# Patient Record
Sex: Male | Born: 1965 | Race: Black or African American | Hispanic: No | Marital: Married | Smoking: Never smoker
Health system: Southern US, Community
[De-identification: ages and names within clinical notes are randomized; demographics above are authoritative.]

## PROBLEM LIST (undated history)

## (undated) DIAGNOSIS — N529 Male erectile dysfunction, unspecified: Secondary | ICD-10-CM

## (undated) DIAGNOSIS — K648 Other hemorrhoids: Secondary | ICD-10-CM

## (undated) DIAGNOSIS — I10 Essential (primary) hypertension: Secondary | ICD-10-CM

## (undated) DIAGNOSIS — E785 Hyperlipidemia, unspecified: Secondary | ICD-10-CM

## (undated) DIAGNOSIS — M722 Plantar fascial fibromatosis: Secondary | ICD-10-CM

## (undated) DIAGNOSIS — E7800 Pure hypercholesterolemia, unspecified: Secondary | ICD-10-CM

## (undated) DIAGNOSIS — J309 Allergic rhinitis, unspecified: Secondary | ICD-10-CM

## (undated) HISTORY — DX: Essential (primary) hypertension: I10

## (undated) HISTORY — DX: Other hemorrhoids: K64.8

## (undated) HISTORY — DX: Male erectile dysfunction, unspecified: N52.9

## (undated) HISTORY — DX: Allergic rhinitis, unspecified: J30.9

## (undated) HISTORY — DX: Pure hypercholesterolemia, unspecified: E78.00

## (undated) HISTORY — DX: Plantar fascial fibromatosis: M72.2

## (undated) HISTORY — PX: FACIAL FRACTURE SURGERY: SHX1570

## (undated) HISTORY — PX: OTHER SURGICAL HISTORY: SHX169

## (undated) HISTORY — DX: Hyperlipidemia, unspecified: E78.5

---

## 1999-05-24 ENCOUNTER — Encounter (INDEPENDENT_AMBULATORY_CARE_PROVIDER_SITE_OTHER): Payer: Self-pay | Admitting: Specialist

## 1999-05-24 ENCOUNTER — Other Ambulatory Visit: Admission: RE | Admit: 1999-05-24 | Discharge: 1999-05-24 | Payer: Self-pay | Admitting: Surgery

## 2005-10-24 ENCOUNTER — Ambulatory Visit: Payer: Self-pay | Admitting: Internal Medicine

## 2006-04-15 ENCOUNTER — Ambulatory Visit: Payer: Self-pay | Admitting: Internal Medicine

## 2006-04-22 ENCOUNTER — Ambulatory Visit: Payer: Self-pay | Admitting: Internal Medicine

## 2007-05-07 ENCOUNTER — Ambulatory Visit: Payer: Self-pay | Admitting: Internal Medicine

## 2007-05-07 LAB — CONVERTED CEMR LAB
ALT: 19 units/L (ref 0–53)
AST: 18 units/L (ref 0–37)
Albumin: 3.8 g/dL (ref 3.5–5.2)
Alkaline Phosphatase: 57 units/L (ref 39–117)
BUN: 11 mg/dL (ref 6–23)
Basophils Absolute: 0.2 10*3/uL — ABNORMAL HIGH (ref 0.0–0.1)
Basophils Relative: 2.2 % — ABNORMAL HIGH (ref 0.0–1.0)
Bilirubin Urine: NEGATIVE
Bilirubin, Direct: 0.1 mg/dL (ref 0.0–0.3)
CO2: 32 meq/L (ref 19–32)
Calcium: 10 mg/dL (ref 8.4–10.5)
Chloride: 115 meq/L — ABNORMAL HIGH (ref 96–112)
Cholesterol: 227 mg/dL (ref 0–200)
Creatinine, Ser: 1.1 mg/dL (ref 0.4–1.5)
Direct LDL: 152.9 mg/dL
Eosinophils Absolute: 0.1 10*3/uL (ref 0.0–0.6)
Eosinophils Relative: 1.2 % (ref 0.0–5.0)
GFR calc Af Amer: 94 mL/min
GFR calc non Af Amer: 78 mL/min
Glucose, Bld: 96 mg/dL (ref 70–99)
HCT: 42.7 % (ref 39.0–52.0)
HDL: 38.1 mg/dL — ABNORMAL LOW (ref >39.0)
Hemoglobin, Urine: NEGATIVE
Hemoglobin: 14.8 g/dL (ref 13.0–17.0)
Ketones, ur: NEGATIVE mg/dL
Leukocytes, UA: NEGATIVE
Lymphocytes Relative: 27.6 % (ref 12.0–46.0)
MCHC: 34.6 g/dL (ref 30.0–36.0)
MCV: 83.1 fL (ref 78.0–100.0)
Monocytes Absolute: 0.8 10*3/uL — ABNORMAL HIGH (ref 0.2–0.7)
Monocytes Relative: 9.4 % (ref 3.0–11.0)
Neutro Abs: 5.1 10*3/uL (ref 1.4–7.7)
Neutrophils Relative %: 59.6 % (ref 43.0–77.0)
Nitrite: NEGATIVE
PSA: 1.59 ng/mL (ref 0.10–4.00)
Platelets: 256 10*3/uL (ref 150–400)
Potassium: 4.6 meq/L (ref 3.5–5.1)
RBC: 5.14 M/uL (ref 4.22–5.81)
RDW: 12.6 % (ref 11.5–14.6)
Sodium: 151 meq/L — ABNORMAL HIGH (ref 135–145)
Specific Gravity, Urine: >=1.03 (ref 1.000–1.03)
TSH: 0.61 microintl units/mL (ref 0.35–5.50)
Total Bilirubin: 0.7 mg/dL (ref 0.3–1.2)
Total CHOL/HDL Ratio: 6
Total Protein, Urine: NEGATIVE mg/dL
Total Protein: 7.3 g/dL (ref 6.0–8.3)
Triglycerides: 233 mg/dL (ref 0–149)
Urine Glucose: NEGATIVE mg/dL
Urobilinogen, UA: 1 (ref 0.0–1.0)
VLDL: 47 mg/dL — ABNORMAL HIGH (ref 0–40)
WBC: 8.6 10*3/uL (ref 4.5–10.5)
pH: 5.5 (ref 5.0–8.0)

## 2007-05-08 ENCOUNTER — Ambulatory Visit: Payer: Self-pay | Admitting: Internal Medicine

## 2007-06-24 ENCOUNTER — Encounter: Payer: Self-pay | Admitting: Internal Medicine

## 2007-06-24 DIAGNOSIS — M722 Plantar fascial fibromatosis: Secondary | ICD-10-CM | POA: Insufficient documentation

## 2007-06-24 DIAGNOSIS — J309 Allergic rhinitis, unspecified: Secondary | ICD-10-CM | POA: Insufficient documentation

## 2007-06-24 DIAGNOSIS — E7800 Pure hypercholesterolemia, unspecified: Secondary | ICD-10-CM | POA: Insufficient documentation

## 2007-09-20 ENCOUNTER — Ambulatory Visit: Payer: Self-pay | Admitting: Internal Medicine

## 2007-09-24 ENCOUNTER — Telehealth (INDEPENDENT_AMBULATORY_CARE_PROVIDER_SITE_OTHER): Payer: Self-pay | Admitting: *Deleted

## 2008-03-10 ENCOUNTER — Emergency Department (HOSPITAL_COMMUNITY): Admission: EM | Admit: 2008-03-10 | Discharge: 2008-03-10 | Payer: Self-pay | Admitting: Emergency Medicine

## 2008-03-18 ENCOUNTER — Telehealth: Payer: Self-pay | Admitting: Internal Medicine

## 2008-03-22 ENCOUNTER — Telehealth (INDEPENDENT_AMBULATORY_CARE_PROVIDER_SITE_OTHER): Payer: Self-pay | Admitting: *Deleted

## 2008-03-23 ENCOUNTER — Ambulatory Visit: Payer: Self-pay | Admitting: Internal Medicine

## 2008-03-23 DIAGNOSIS — E785 Hyperlipidemia, unspecified: Secondary | ICD-10-CM | POA: Insufficient documentation

## 2008-03-24 ENCOUNTER — Encounter (INDEPENDENT_AMBULATORY_CARE_PROVIDER_SITE_OTHER): Payer: Self-pay | Admitting: *Deleted

## 2008-03-29 ENCOUNTER — Telehealth (INDEPENDENT_AMBULATORY_CARE_PROVIDER_SITE_OTHER): Payer: Self-pay | Admitting: *Deleted

## 2008-04-01 ENCOUNTER — Telehealth: Payer: Self-pay | Admitting: Internal Medicine

## 2008-04-01 ENCOUNTER — Telehealth (INDEPENDENT_AMBULATORY_CARE_PROVIDER_SITE_OTHER): Payer: Self-pay | Admitting: *Deleted

## 2008-04-01 ENCOUNTER — Encounter (INDEPENDENT_AMBULATORY_CARE_PROVIDER_SITE_OTHER): Payer: Self-pay | Admitting: *Deleted

## 2008-04-13 ENCOUNTER — Telehealth: Payer: Self-pay | Admitting: Internal Medicine

## 2008-04-13 ENCOUNTER — Ambulatory Visit: Payer: Self-pay | Admitting: Gastroenterology

## 2008-04-13 ENCOUNTER — Telehealth (INDEPENDENT_AMBULATORY_CARE_PROVIDER_SITE_OTHER): Payer: Self-pay | Admitting: *Deleted

## 2008-04-13 DIAGNOSIS — K648 Other hemorrhoids: Secondary | ICD-10-CM | POA: Insufficient documentation

## 2008-04-14 ENCOUNTER — Telehealth: Payer: Self-pay | Admitting: Gastroenterology

## 2008-04-19 ENCOUNTER — Telehealth (INDEPENDENT_AMBULATORY_CARE_PROVIDER_SITE_OTHER): Payer: Self-pay | Admitting: *Deleted

## 2008-04-21 ENCOUNTER — Ambulatory Visit: Payer: Self-pay | Admitting: Gastroenterology

## 2008-04-21 ENCOUNTER — Ambulatory Visit (HOSPITAL_COMMUNITY): Admission: RE | Admit: 2008-04-21 | Discharge: 2008-04-21 | Payer: Self-pay | Admitting: Gastroenterology

## 2008-04-21 ENCOUNTER — Encounter: Payer: Self-pay | Admitting: Internal Medicine

## 2008-04-22 ENCOUNTER — Encounter: Payer: Self-pay | Admitting: Gastroenterology

## 2008-04-23 ENCOUNTER — Telehealth: Payer: Self-pay | Admitting: Gastroenterology

## 2008-04-26 ENCOUNTER — Telehealth: Payer: Self-pay | Admitting: Gastroenterology

## 2008-04-30 ENCOUNTER — Telehealth: Payer: Self-pay | Admitting: Gastroenterology

## 2008-05-19 ENCOUNTER — Ambulatory Visit: Payer: Self-pay | Admitting: Internal Medicine

## 2008-05-19 DIAGNOSIS — I10 Essential (primary) hypertension: Secondary | ICD-10-CM | POA: Insufficient documentation

## 2008-05-19 LAB — CONVERTED CEMR LAB
ALT: 31 units/L (ref 0–53)
AST: 26 units/L (ref 0–37)
Albumin: 4 g/dL (ref 3.5–5.2)
Alkaline Phosphatase: 47 units/L (ref 39–117)
BUN: 13 mg/dL (ref 6–23)
Basophils Absolute: 0 10*3/uL (ref 0.0–0.1)
Basophils Relative: 0.5 % (ref 0.0–3.0)
Bilirubin Urine: NEGATIVE
Bilirubin, Direct: 0.1 mg/dL (ref 0.0–0.3)
CO2: 28 meq/L (ref 19–32)
Calcium: 9.2 mg/dL (ref 8.4–10.5)
Chloride: 106 meq/L (ref 96–112)
Cholesterol: 265 mg/dL (ref 0–200)
Creatinine, Ser: 1 mg/dL (ref 0.4–1.5)
Direct LDL: 179.4 mg/dL
Eosinophils Absolute: 0.2 10*3/uL (ref 0.0–0.7)
Eosinophils Relative: 2.7 % (ref 0.0–5.0)
GFR calc Af Amer: 104 mL/min
GFR calc non Af Amer: 86 mL/min
Glucose, Bld: 96 mg/dL (ref 70–99)
HCT: 44.7 % (ref 39.0–52.0)
HDL: 56.2 mg/dL (ref >39.0)
Hemoglobin: 15.3 g/dL (ref 13.0–17.0)
Ketones, ur: NEGATIVE mg/dL
Leukocytes, UA: NEGATIVE
Lymphocytes Relative: 29.7 % (ref 12.0–46.0)
MCHC: 34.2 g/dL (ref 30.0–36.0)
MCV: 85.6 fL (ref 78.0–100.0)
Monocytes Absolute: 0.6 10*3/uL (ref 0.1–1.0)
Monocytes Relative: 9.5 % (ref 3.0–12.0)
Neutro Abs: 4 10*3/uL (ref 1.4–7.7)
Neutrophils Relative %: 57.6 % (ref 43.0–77.0)
Nitrite: NEGATIVE
PSA: 1.11 ng/mL (ref 0.10–4.00)
Platelets: 201 10*3/uL (ref 150–400)
Potassium: 4 meq/L (ref 3.5–5.1)
RBC: 5.22 M/uL (ref 4.22–5.81)
RDW: 12.7 % (ref 11.5–14.6)
Sodium: 138 meq/L (ref 135–145)
Specific Gravity, Urine: 1.02 (ref 1.000–1.03)
TSH: 0.79 microintl units/mL (ref 0.35–5.50)
Total Bilirubin: 0.8 mg/dL (ref 0.3–1.2)
Total CHOL/HDL Ratio: 4.7
Total Protein, Urine: NEGATIVE mg/dL
Total Protein: 7.4 g/dL (ref 6.0–8.3)
Triglycerides: 135 mg/dL (ref 0–149)
Urine Glucose: NEGATIVE mg/dL
Urobilinogen, UA: 0.2 (ref 0.0–1.0)
VLDL: 27 mg/dL (ref 0–40)
WBC: 6.8 10*3/uL (ref 4.5–10.5)
pH: 6 (ref 5.0–8.0)

## 2008-05-21 ENCOUNTER — Telehealth: Payer: Self-pay | Admitting: Gastroenterology

## 2008-06-08 ENCOUNTER — Ambulatory Visit: Payer: Self-pay | Admitting: Gastroenterology

## 2008-08-13 ENCOUNTER — Telehealth: Payer: Self-pay | Admitting: Gastroenterology

## 2008-11-09 ENCOUNTER — Encounter: Payer: Self-pay | Admitting: Endocrinology

## 2008-11-09 ENCOUNTER — Ambulatory Visit: Payer: Self-pay | Admitting: Endocrinology

## 2008-11-09 DIAGNOSIS — R05 Cough: Secondary | ICD-10-CM

## 2008-11-09 DIAGNOSIS — R059 Cough, unspecified: Secondary | ICD-10-CM | POA: Insufficient documentation

## 2008-11-19 ENCOUNTER — Telehealth (INDEPENDENT_AMBULATORY_CARE_PROVIDER_SITE_OTHER): Payer: Self-pay | Admitting: *Deleted

## 2009-10-21 ENCOUNTER — Ambulatory Visit: Payer: Self-pay | Admitting: Internal Medicine

## 2009-10-21 ENCOUNTER — Encounter (INDEPENDENT_AMBULATORY_CARE_PROVIDER_SITE_OTHER): Payer: Self-pay | Admitting: *Deleted

## 2009-10-21 DIAGNOSIS — J069 Acute upper respiratory infection, unspecified: Secondary | ICD-10-CM | POA: Insufficient documentation

## 2010-04-06 ENCOUNTER — Ambulatory Visit: Payer: Self-pay | Admitting: Internal Medicine

## 2010-04-06 DIAGNOSIS — R42 Dizziness and giddiness: Secondary | ICD-10-CM | POA: Insufficient documentation

## 2010-04-06 LAB — CONVERTED CEMR LAB
BUN: 11 mg/dL (ref 6–23)
Basophils Absolute: 0 10*3/uL (ref 0.0–0.1)
CO2: 29 meq/L (ref 19–32)
Calcium: 9 mg/dL (ref 8.4–10.5)
Eosinophils Absolute: 0.1 10*3/uL (ref 0.0–0.7)
GFR calc non Af Amer: 109.78 mL/min (ref >60)
Glucose, Bld: 86 mg/dL (ref 70–99)
HCT: 44.2 % (ref 39.0–52.0)
Lymphocytes Relative: 33.6 % (ref 12.0–46.0)
Lymphs Abs: 2.3 10*3/uL (ref 0.7–4.0)
MCHC: 33.9 g/dL (ref 30.0–36.0)
Monocytes Relative: 11.9 % (ref 3.0–12.0)
Platelets: 213 10*3/uL (ref 150.0–400.0)
RDW: 13.4 % (ref 11.5–14.6)

## 2010-09-11 ENCOUNTER — Other Ambulatory Visit: Payer: Self-pay | Admitting: Internal Medicine

## 2010-09-11 ENCOUNTER — Ambulatory Visit
Admission: RE | Admit: 2010-09-11 | Discharge: 2010-09-11 | Payer: Self-pay | Source: Home / Self Care | Attending: Internal Medicine | Admitting: Internal Medicine

## 2010-09-11 LAB — BASIC METABOLIC PANEL
BUN: 11 mg/dL (ref 6–23)
CO2: 29 mEq/L (ref 19–32)
Calcium: 9.2 mg/dL (ref 8.4–10.5)
Chloride: 104 mEq/L (ref 96–112)
Creatinine, Ser: 1 mg/dL (ref 0.4–1.5)
GFR: 103.28 mL/min (ref >60.00)
Glucose, Bld: 92 mg/dL (ref 70–99)
Potassium: 4.3 mEq/L (ref 3.5–5.1)
Sodium: 138 mEq/L (ref 135–145)

## 2010-09-11 LAB — LIPID PANEL
Cholesterol: 239 mg/dL — ABNORMAL HIGH (ref 0–200)
HDL: 47.2 mg/dL (ref >39.00)
Total CHOL/HDL Ratio: 5
Triglycerides: 134 mg/dL (ref 0.0–149.0)
VLDL: 26.8 mg/dL (ref 0.0–40.0)

## 2010-09-11 LAB — URINALYSIS
Bilirubin Urine: NEGATIVE
Hemoglobin, Urine: NEGATIVE
Ketones, ur: NEGATIVE
Leukocytes, UA: NEGATIVE
Nitrite: NEGATIVE
Specific Gravity, Urine: >=1.03 (ref 1.000–1.030)
Total Protein, Urine: NEGATIVE
Urine Glucose: NEGATIVE
Urobilinogen, UA: 0.2 (ref 0.0–1.0)
pH: 5 (ref 5.0–8.0)

## 2010-09-11 LAB — TSH: TSH: 1.37 u[IU]/mL (ref 0.35–5.50)

## 2010-09-11 LAB — CBC WITH DIFFERENTIAL/PLATELET
Basophils Absolute: 0 10*3/uL (ref 0.0–0.1)
Basophils Relative: 0.2 % (ref 0.0–3.0)
Eosinophils Absolute: 0.3 10*3/uL (ref 0.0–0.7)
Eosinophils Relative: 3.2 % (ref 0.0–5.0)
HCT: 44.9 % (ref 39.0–52.0)
Hemoglobin: 14.8 g/dL (ref 13.0–17.0)
Lymphocytes Relative: 32.5 % (ref 12.0–46.0)
Lymphs Abs: 2.7 10*3/uL (ref 0.7–4.0)
MCHC: 32.8 g/dL (ref 30.0–36.0)
MCV: 85.9 fl (ref 78.0–100.0)
Monocytes Absolute: 0.7 10*3/uL (ref 0.1–1.0)
Monocytes Relative: 8.8 % (ref 3.0–12.0)
Neutro Abs: 4.6 10*3/uL (ref 1.4–7.7)
Neutrophils Relative %: 55.3 % (ref 43.0–77.0)
Platelets: 204 10*3/uL (ref 150.0–400.0)
RBC: 5.23 Mil/uL (ref 4.22–5.81)
RDW: 13.4 % (ref 11.5–14.6)
WBC: 8.3 10*3/uL (ref 4.5–10.5)

## 2010-09-11 LAB — HEPATIC FUNCTION PANEL
ALT: 27 U/L (ref 0–53)
AST: 21 U/L (ref 0–37)
Albumin: 3.9 g/dL (ref 3.5–5.2)
Alkaline Phosphatase: 46 U/L (ref 39–117)
Bilirubin, Direct: 0.1 mg/dL (ref 0.0–0.3)
Total Bilirubin: 0.9 mg/dL (ref 0.3–1.2)
Total Protein: 7.3 g/dL (ref 6.0–8.3)

## 2010-09-11 LAB — PSA: PSA: 1.32 ng/mL (ref 0.10–4.00)

## 2010-09-12 LAB — LDL CHOLESTEROL, DIRECT: Direct LDL: 177.9 mg/dL

## 2010-09-29 ENCOUNTER — Encounter: Payer: Self-pay | Admitting: Internal Medicine

## 2010-09-29 ENCOUNTER — Ambulatory Visit
Admission: RE | Admit: 2010-09-29 | Discharge: 2010-09-29 | Payer: Self-pay | Source: Home / Self Care | Attending: Internal Medicine | Admitting: Internal Medicine

## 2010-09-29 DIAGNOSIS — M25569 Pain in unspecified knee: Secondary | ICD-10-CM | POA: Insufficient documentation

## 2010-10-05 NOTE — Assessment & Plan Note (Signed)
Summary: PER DAH SCHED DR JWJ PT FLU LIKE SYMP -NO FEVER--STC   Vital Signs:  Patient profile:   45 year old male Height:      77 inches (195.58 cm) Weight:      249.0 pounds (113.18 kg) BMI:     29.63 O2 Sat:      95 % on Room air Temp:     100.1 degrees F (37.83 degrees C) oral Pulse rate:   98 / minute BP sitting:   140 / 72  (left arm) Cuff size:   large  Vitals Entered By: Orlan Leavens (October 21, 2009 4:27 PM)  O2 Flow:  Room air CC: cold sxs/ fever Is Patient Diabetic? No Pain Assessment Patient in pain? no        Primary Care Provider:  Oliver Barre, M.D.  CC:  cold sxs/ fever.  History of Present Illness: here today with complaint of  cold. onset of symptoms was 6 days ago. course has been sudden onset and now occurs in intermittent waxing/waning pattern. problem precipitated by +sick contacts at work symptom characterized as chest congestion and dry cough problem associated with N/V, "queasy" and fever but not associated with diarrhea, sputum or HA. symptoms improved by nothing - tried Tamiflu with little relief. symptoms worsened with activity such as tring to work - "i'm too tired".  Current Medications (verified): 1)  Anusol-Hc 25 Mg  Supp (Hydrocortisone Acetate) .... Insert 1 Supp Rectally Two Times A Day 2)  Mucinex 600 Mg Xr12h-Tab (Guaifenesin) .... Take 1-2 By Mouth Qd 3)  Alka-Seltzer Plus Cold 2-7.8-325 Mg Tbef (Chlorphen-Phenyleph-Asa) .... Take Prn 4)  Robitussin Dm 100-10 Mg/21ml Syrp (Dextromethorphan-Guaifenesin) .... Use Prn  Allergies (verified): No Known Drug Allergies  Past History:  Past Medical History: Reviewed history from 05/19/2008 and no changes required. Hyperlipidemia Allergic rhinitis E.D. Hemorrhoids Hypertension  Review of Systems       The patient complains of anorexia and fever.  The patient denies hoarseness, chest pain, syncope, headaches, and abdominal pain.    Physical Exam  General:  alert, well-developed,  well-nourished, and cooperative to examination.   mildly ill and uncomfortable Eyes:  vision grossly intact; pupils equal, round and reactive to light.  mild conjunctivitis bilaterally but lids normal.    Ears:  normal pinnae bilaterally, without erythema, swelling, or tenderness to palpation. TMs clear, without effusion, or cerumen impaction. Hearing grossly normal bilaterally  Mouth:  teeth and gums in good repair; mucous membranes moist, without ulcers. oropharynx clear without exudate, mod erythema. +vesicles on uvula region Lungs:  normal respiratory effort, no intercostal retractions or use of accessory muscles; normal breath sounds bilaterally - no crackles and no wheezes.    Heart:  normal rate, regular rhythm, no murmur, and no rub. BLE without edema.  Abdomen:  soft, non-tender, normal bowel sounds, no distention; no masses and no appreciable hepatomegaly or splenomegaly.   Neurologic:  alert & oriented X3 and cranial nerves II-XII symetrically intact.  strength normal in all extremities, sensation intact to light touch, and gait normal. speech fluent without dysarthria or aphasia; follows commands with good comprehension.    Impression & Recommendations:  Problem # 1:  VIRAL URI (ICD-465.9)  emperic tamiflu and symptoms control - out of work note x 72 h The following medications were removed from the medication list:    Tussionex Pennkinetic Er 8-10 Mg/15ml Lqcr (Chlorpheniramine-hydrocodone) .Marland Kitchen... 1 tsp q8h as needed cough His updated medication list for this problem includes:  Mucinex 600 Mg Xr12h-tab (Guaifenesin) .Marland Kitchen... Take 1-2 by mouth qd    Alka-seltzer Plus Cold 2-7.8-325 Mg Tbef (Chlorphen-phenyleph-asa) .Marland Kitchen... Take prn    Robitussin Dm 100-10 Mg/100ml Syrp (Dextromethorphan-guaifenesin) ..... Use prn    Promethazine Vc Plain 6.25-5 Mg/85ml Syrp (Promethazine-phenylephrine) .Marland KitchenMarland KitchenMarland KitchenMarland Kitchen 5 cc by mouth every 4 hours as needed for nasuea and cough  Instructed on symptomatic treatment.  Call if symptoms persist or worsen.   Orders: Prescription Created Electronically [phone])  Complete Medication List: 1)  Anusol-hc 25 Mg Supp (Hydrocortisone acetate) .... Insert 1 supp rectally two times a day 2)  Mucinex 600 Mg Xr12h-tab (Guaifenesin) .... Take 1-2 by mouth qd 3)  Alka-seltzer Plus Cold 2-7.8-325 Mg Tbef (Chlorphen-phenyleph-asa) .... Take prn 4)  Robitussin Dm 100-10 Mg/20ml Syrp (Dextromethorphan-guaifenesin) .... Use prn 5)  Promethazine Vc Plain 6.25-5 Mg/43ml Syrp (Promethazine-phenylephrine) .... 5 cc by mouth every 4 hours as needed for nasuea and cough 6)  Tamiflu 75 Mg Caps (Oseltamivir phosphate) .Marland Kitchen.. 1 by mouth two times a day x 5days  Patient Instructions: 1)  it was good to see you today. 2)  tamiflu and phenergan syrup (without codiene) as discussed - your prescriptions have been electronically submitted to your pharmacy. Please take as directed. Contact our office if you believe you're having problems with the medication(s).  3)  continue theraflu and cold medications as needed for symptoms  4)  Get plenty of rest, drink lots of clear liquids, and use Tylenol or Ibuprofen for fever and comfort. Return in 7-10 days if you're not better:sooner if you're feeling worse. 5)  Recommended remaining out of work for next 3 days - work note given Prescriptions: TAMIFLU 75 MG CAPS (OSELTAMIVIR PHOSPHATE) 1 by mouth two times a day x 5days  #10 x 0   Entered and Authorized by:   Newt Lukes MD   Signed by:   Newt Lukes MD on 10/21/2009   Method used:   Electronically to        [facility] [address] [id number][facility] (retail)       [address]       [place], [place]  [address]       Ph: [phone] or [phone]       Fax: [phone]   RxID:   [id number] PROMETHAZINE VC PLAIN 6.25-5 MG/5ML SYRP (PROMETHAZINE-PHENYLEPHRINE) 5 cc by mouth every 4 hours as needed for nasuea and cough  #200cc x 0   Entered and Authorized by:    Newt Lukes MD   Signed by:   Newt Lukes MD on 10/21/2009   Method used:   [facility]        [facility] [address] [id number][facility] (retail)       [address]       [place], [place]  [address]       Ph: [phone] or [phone]       Fax: [phone]   RxID:   [id number]

## 2010-10-05 NOTE — Letter (Signed)
Summary: Out of Work  [facility]  [address] [place], [place] [address]   Phone: [phone]  Fax: [phone]    April 06, 2010   Employee:  Edward Reid    To Whom It May Concern:   For Medical reasons, please excuse the above named employee from work for the following dates:  Start:   Apr 06, 2010  End:   Apr 09, 2010   -    to return to work Apr 10, 2010 without restriction  If you need additional information, please feel free to contact our office.         Sincerely,    Corwin Levins MD

## 2010-10-05 NOTE — Assessment & Plan Note (Signed)
Summary: DIZZY X 3 DAYS/ PER TRIAGE/NWS   Vital Signs:  Patient profile:   45 year old male Height:      77 inches Weight:      239.25 pounds BMI:     28.47 O2 Sat:      98 % on Room air Temp:     98.1 degrees F oral Pulse rate:   77 / minute BP supine:   112 / 70  (left arm) BP sitting:   102 / 70  (left arm) BP standing:   98 / 70  (left arm) Cuff size:   large  Vitals Entered By: Zella Ball Ewing CMA Duncan Dull) (April 06, 2010 3:11 PM)  O2 Flow:  Room air CC: Dizzy for 3 days/RE   Primary Care Provider:  Oliver Barre, M.D.  CC:  Dizzy for 3 days/RE.  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  No frank syncope or fall. No illicit drug use.  Problems Prior to Update: 1)  Orthostatic Dizziness  (ICD-780.4) 2)  Viral Uri  (ICD-465.9) 3)  Cough  (ICD-786.2) 4)  Hypertension  (ICD-401.9) 5)  Preventive Health Care  (ICD-V70.0) 6)  Internal Hemorrhoids  (ICD-455.0) 7)  Hyperlipidemia  (ICD-272.4) 8)  Fasciitis, Plantar  (ICD-728.71) 9)  Allergic Rhinitis  (ICD-477.9) 10)  Hypercholesterolemia  (ICD-272.0)  Medications Prior to Update: 1)  Anusol-Hc 25 Mg  Supp (Hydrocortisone Acetate) .... Insert 1 Supp Rectally Two Times A Day 2)  Mucinex 600 Mg Xr12h-Tab (Guaifenesin) .... Take 1-2 By Mouth Qd 3)  Alka-Seltzer Plus Cold 2-7.8-325 Mg Tbef (Chlorphen-Phenyleph-Asa) .... Take Prn 4)  Robitussin Dm 100-10 Mg/50ml Syrp (Dextromethorphan-Guaifenesin) .... Use Prn 5)  Promethazine Vc Plain 6.25-5 Mg/2ml Syrp (Promethazine-Phenylephrine) .... 5 Cc By Mouth Every 4 Hours As  Needed For Nasuea and Cough  Current Medications (verified): 1)  Anusol-Hc 25 Mg  Supp (Hydrocortisone Acetate) .... Insert 1 Supp Rectally Two Times A Day 2)  Mucinex 600 Mg Xr12h-Tab (Guaifenesin) .... Take 1-2 By Mouth Qd 3)  Alka-Seltzer Plus Cold 2-7.8-325 Mg Tbef (Chlorphen-Phenyleph-Asa) .... Take Prn 4)  Robitussin Dm 100-10 Mg/66ml Syrp (Dextromethorphan-Guaifenesin) .... Use Prn 5)  Promethazine Vc Plain 6.25-5 Mg/62ml Syrp (Promethazine-Phenylephrine) .... 5 Cc By Mouth Every 4 Hours As Needed For Nasuea and Cough  Allergies (verified): No Known Drug Allergies  Past History:  Past Medical History: Last updated: 05/19/2008 Hyperlipidemia Allergic rhinitis E.D. Hemorrhoids Hypertension  Past Surgical History: Last updated: 03/23/2008 Mass excision from Left forearm - lipoma s/p facial surgury  Social History: Last updated: 04/13/2008 Married no cigarettes occasional cigar works [employer] Alcohol use-no 3 children Daily Caffeine Use  Risk Factors: Caffeine Use: 2 (04/13/2008)  Risk Factors: Smoking Status: current (06/24/2007) Packs/Day: cigar occasionally (06/24/2007)  Review of Systems       all otherwise negative per pt -    Physical Exam  General:  alert and overweight-appearing.   Head:  normocephalic and atraumatic.   Eyes:  vision grossly intact, pupils equal, and pupils round.   Ears:  R ear normal  and L ear normal.   Nose:  no external deformity and no nasal discharge.   Mouth:  no gingival abnormalities and pharynx pink and moist.   Neck:  supple and no masses.   Lungs:  normal respiratory effort and no dullness.   Heart:  normal rate and regular rhythm.   Abdomen:  soft, non-tender, and normal bowel sounds.   Msk:  no joint tenderness and no joint swelling.   Extremities:  no edema, no erythema  Neurologic:  alert & oriented X3 and strength normal in all extremities.   Skin:  no rashes.   Psych:  not anxious appearing and not depressed  appearing.     Impression & Recommendations:  Problem # 1:  ORTHOSTATIC DIZZINESS (ICD-780.4)  liekly due to dehydration;  to check bmet, but also to be off work for 3 days, no outside activities or gym in the next 3 days; drink LOTS of fluids  Orders: TLB-BMP (Basic Metabolic Panel-BMET) (80048-METABOL) TLB-CBC Platelet - w/Differential (85025-CBCD)  Problem # 2:  HYPERTENSION (ICD-401.9)  no requiring meds at this time  BP today: 102/70 Prior BP: 140/72 (10/21/2009)  Labs Reviewed: K+: 4.0 (05/19/2008) Creat: : 1.0 (05/19/2008)   Chol: 265 (05/19/2008)   HDL: 56.2 (05/19/2008)   LDL: DEL (05/19/2008)   TG: 135 (05/19/2008)  Complete Medication List: 1)  Anusol-hc 25 Mg Supp (Hydrocortisone acetate) .... Insert 1 supp rectally two times a day 2)  Mucinex 600 Mg Xr12h-tab (Guaifenesin) .... Take 1-2 by mouth qd 3)  Alka-seltzer Plus Cold 2-7.8-325 Mg Tbef (Chlorphen-phenyleph-asa) .... Take prn 4)  Robitussin Dm 100-10 Mg/72ml Syrp (Dextromethorphan-guaifenesin) .... Use prn 5)  Promethazine Vc Plain 6.25-5 Mg/30ml Syrp (Promethazine-phenylephrine) .... 5 cc by mouth every 4 hours as needed for nasuea and cough  Patient Instructions: 1)  You appear to have moderate dehydration today 2)  Please go to the Lab in the basement for your blood and/or urine tests today 3)  You are given the note to be off work for 3 days 4)  Please avoid all outdoor activities and GYM until next Monday 5)  Please drink as much fluids as possible in the next 3 days, but please avoid alcohol as this can make dehydration worse.   6)  Please schedule a follow-up appointment as needed.

## 2010-10-05 NOTE — Letter (Signed)
Summary: Out of Work  [facility]  [address] [place], [place] [address]   Phone: [phone]  Fax: [phone]    October 21, 2009   Employee:  Tillman Sers    To Whom It May Concern:   For Medical reasons, please excuse the above named employee from work for the following dates:  Start: 10/21/09    End: 10/24/09, May return to work on Tuesday 10/25/09    If you need additional information, please feel free to contact our office.         Sincerely,    Dr. Rene Paci

## 2010-10-05 NOTE — Assessment & Plan Note (Signed)
Summary: CPX/ NWS  #   Vital Signs:  Patient profile:   45 year old male Height:      75.5 inches Weight:      246.50 pounds BMI:     30.51 O2 Sat:      97 % on Room air Temp:     98.2 degrees F oral Pulse rate:   82 / minute BP sitting:   118 / 80  (left arm) Cuff size:   large  Vitals Entered By: Zella Ball Ewing CMA Duncan Dull) (September 29, 2010 10:43 AM)  O2 Flow:  Room air  CC: adult Physical/RE   Primary Care Provider:  Oliver Barre, M.D.  CC:  adult Physical/RE.  History of Present Illness: here for wellness and f/u; overall doing well, Pt denies CP, worsening sob, doe, wheezing, orthopnea, pnd, worsening LE edema, palps, dizziness or syncope  Pt denies new neuro symptoms such as headache, facial or extremity weakness  Pt denies polydipsia, polyuria   Overall good compliance with meds, trying to follow low chol diet, wt peak was 261, then with excercise to 232, then 246 today with recent 2 mo worsening right knee pain, with some tendency to hyperextend backwards;  works for [employer] and has been able to keep up but does lift up to 50 lbs on regular basis, and pain is too much to ingnore now, but fortunately as not missed work and no swelling;  pain worse to play basketball and thinks may have twisted at some point as well, but again no swelling.  No fever, wt loss, night sweats, loss of appetite or other constitutional symptoms  Overall good compliance with meds, and good tolerability.  Denies worsening depressive symptoms, suicidal ideation, or panic.   Pt states good ability with ADL's, low fall risk, home safety reviewed and adequate, no significant change in hearing or vision, trying to follow lower chol diet, and occasionally active only with regular excercise with basketball as couple of times per wk     Problems Prior to Update: 1)  Knee Pain, Right  (ICD-719.46) 2)  Orthostatic Dizziness  (ICD-780.4) 3)  Viral Uri  (ICD-465.9) 4)  Cough  (ICD-786.2) 5)  Hypertension  (ICD-401.9) 6)   Preventive Health Care  (ICD-V70.0) 7)  Internal Hemorrhoids  (ICD-455.0) 8)  Hyperlipidemia  (ICD-272.4) 9)  Fasciitis, Plantar  (ICD-728.71) 10)  Allergic Rhinitis  (ICD-477.9) 11)  Hypercholesterolemia  (ICD-272.0)  Medications Prior to Update: 1)  Anusol-Hc 25 Mg  Supp (Hydrocortisone Acetate) .... Insert 1 Supp Rectally Two Times A Day 2)  Mucinex 600 Mg Xr12h-Tab (Guaifenesin) .... Take 1-2 By Mouth Qd 3)  Alka-Seltzer Plus Cold 2-7.8-325 Mg Tbef (Chlorphen-Phenyleph-Asa) .... Take Prn 4)  Robitussin Dm 100-10 Mg/42ml Syrp (Dextromethorphan-Guaifenesin) .... Use Prn 5)  Promethazine Vc Plain 6.25-5 Mg/50ml Syrp (Promethazine-Phenylephrine) .... 5 Cc By Mouth Every 4 Hours As Needed For Nasuea and Cough  Current Medications (verified): 1)  Lipitor 10 Mg Tabs (Atorvastatin Calcium) .Marland Kitchen.. 1po Once Daily 2)  Fexofenadine Hcl 180 Mg Tabs (Fexofenadine Hcl) .Marland Kitchen.. 1po Once Daily As Needed For Allergies  Allergies (verified): No Known Drug Allergies  Past History:  Past Medical History: Last updated: 05/19/2008 Hyperlipidemia Allergic rhinitis E.D. Hemorrhoids Hypertension  Past Surgical History: Last updated: 03/23/2008 Mass excision from Left forearm - lipoma s/p facial surgury  Social History: Last updated: 04/13/2008 Married no cigarettes occasional cigar works [employer] Alcohol use-no 3 children Daily Caffeine Use  Risk Factors: Caffeine Use: 2 (04/13/2008)  Risk Factors: Smoking Status:  current (06/24/2007) Packs/Day: cigar occasionally (06/24/2007)  Review of Systems  The patient denies anorexia, fever, vision loss, decreased hearing, hoarseness, chest pain, syncope, dyspnea on exertion, peripheral edema, prolonged cough, headaches, hemoptysis, abdominal pain, melena, hematochezia, severe indigestion/heartburn, hematuria, muscle weakness, suspicious skin lesions, transient blindness, depression, unusual weight change, abnormal bleeding, enlarged lymph nodes, and  angioedema.         all otherwise negative per pt -  except for mild nasal allergy symjptoms with sneezing and itching  Physical Exam  General:  alert and overweight-appearing.   Head:  normocephalic and atraumatic.   Eyes:  vision grossly intact, pupils equal, and pupils round.   Ears:  R ear normal and L ear normal.   Nose:  no external deformity and no nasal discharge.   Mouth:  pharyngeal erythema and fair dentition.   Neck:  supple and no masses.   Lungs:  normal respiratory effort and no dullness.   Heart:  normal rate and regular rhythm.   Abdomen:  soft, non-tender, and normal bowel sounds.   Msk:  no joint tenderness and no joint swelling.  , right knee with FROM and NT but with click to right anterolat on ROM testing Extremities:  no edema, no erythema  Neurologic:  alert & oriented X3 and strength normal in all extremities.   Skin:  color normal and no rashes.   Psych:  not anxious appearing and not depressed appearing.     Impression & Recommendations:  Problem # 1:  Preventive Health Care (ICD-V70.0) Overall doing well, age appropriate education and counseling updated, referral for preventive services and immunizations addressed, dietary counseling and smoking status adressed , most recent labs reviewed, ecg reviewed I have personally reviewed and have noted 1.The patient's medical and social history 2.Their use of alcohol, tobacco or illicit drugs 3.Their current medications and supplements 4. Functional ability including ADL's, fall risk, home safety risk, hearing & visual impairment  5.Diet and physical activities 6.Evidence for depression or mood disorders The patients weight, height, BMI  have been recorded in the chart I have made referrals, counseling and provided education to the patient based review of the above  Orders: EKG w/ Interpretation (93000)  Problem # 2:  KNEE PAIN, RIGHT (ICD-719.46) ? cartilage tear vs ligamentous issue  - for refer to ortho,  may need MRI Orders: Orthopedic Surgeon Referral (Ortho Surgeon)  Problem # 3:  HYPERLIPIDEMIA (ICD-272.4)  Labs Reviewed: SGOT: 21 (09/11/2010)   SGPT: 27 (09/11/2010)   HDL:47.20 (09/11/2010), 56.2 (05/19/2008)  LDL:DEL (05/19/2008), DEL (05/07/2007)  Chol:239 (09/11/2010), 265 (05/19/2008)  Trig:134.0 (09/11/2010), 135 (05/19/2008) d/w pt , Pt to continue diet efforts, to start generic lipitor   to check labs 4 wks- goal LDL less than 100  His updated medication list for this problem includes:    Lipitor 10 Mg Tabs (Atorvastatin calcium) .Marland Kitchen... 1po once daily  Problem # 4:  ALLERGIC RHINITIS (ICD-477.9)  His updated medication list for this problem includes:    Fexofenadine Hcl 180 Mg Tabs (Fexofenadine hcl) .Marland Kitchen... 1po once daily as needed for allergies treat as above, f/u any worsening signs or symptoms   Complete Medication List: 1)  Lipitor 10 Mg Tabs (Atorvastatin calcium) .Marland Kitchen.. 1po once daily 2)  Fexofenadine Hcl 180 Mg Tabs (Fexofenadine hcl) .Marland Kitchen.. 1po once daily as needed for allergies  Other Orders: Tdap => 55yrs IM (16109) Admin 1st Vaccine (60454)  Patient Instructions: 1)  you had the tetanus shot today 2)  Please take all new  medications as prescribed  - the generic lipitor for choelsterol and the generic allergra as needed allergies 3)  Please return in 4 wks for LAB only:  4)  Lipid Panel, ICD-9: 272.0 5)  Hepatic Panel, ICD-9: v58.69 6)  Continue all previous medications as before this visit 7)  You will be contacted about the referral(s) to: orthopedic for the knee  8)  Please schedule a follow-up appointment in 1 year., or sooner if needed Prescriptions: FEXOFENADINE HCL 180 MG TABS (FEXOFENADINE HCL) 1po once daily as needed for allergies  #90 x 3   Entered and Authorized by:   Corwin Levins MD   Signed by:   Corwin Levins MD on 09/29/2010   Method used:   Print then Give to Patient   RxID:   [id number] LIPITOR 10 MG TABS (ATORVASTATIN CALCIUM) 1po once  daily  #90 x 3   Entered and Authorized by:   Corwin Levins MD   Signed by:   Corwin Levins MD on 09/29/2010   Method used:   Print then Give to Patient   RxID:   [id number]    Orders Added: 1)  EKG w/ Interpretation [93000] 2)  Orthopedic Surgeon Referral [Ortho Surgeon] 3)  Tdap => 33yrs IM [90715] 4)  Admin 1st Vaccine [90471] 5)  Est. Patient 40-64 years [64332]   Immunizations Administered:  Tetanus Vaccine:    Vaccine Type: Tdap    Site: left deltoid    Mfr: Sanofi Pasteur    Dose: 0.5 ml    Route: IM    Given by: Zella Ball Ewing CMA (AAMA)    Exp. Date: 12/21/2011    Lot #: [id number]    VIS given: 07/21/08 version given September 29, 2010.   Immunizations Administered:  Tetanus Vaccine:    Vaccine Type: Tdap    Site: left deltoid    Mfr: Sanofi Pasteur    Dose: 0.5 ml    Route: IM    Given by: Zella Ball Ewing CMA (AAMA)    Exp. Date: 12/21/2011    Lot #: [id number]    VIS given: 07/21/08 version given September 29, 2010.

## 2010-10-13 ENCOUNTER — Encounter: Payer: Self-pay | Admitting: Internal Medicine

## 2010-10-31 NOTE — Letter (Signed)
Summary: [facility]   Imported By: Sherian Rein 10/23/2010 08:09:47  _____________________________________________________________________  External Attachment:    Type:   Image     Comment:   External Document

## 2011-11-09 ENCOUNTER — Encounter: Payer: Self-pay | Admitting: Internal Medicine

## 2011-11-09 ENCOUNTER — Encounter: Payer: Self-pay | Admitting: *Deleted

## 2011-11-09 ENCOUNTER — Ambulatory Visit (INDEPENDENT_AMBULATORY_CARE_PROVIDER_SITE_OTHER): Payer: BC Managed Care – PPO | Admitting: Internal Medicine

## 2011-11-09 VITALS — BP 118/62 | HR 83 | Temp 98.5°F | Ht 77.5 in | Wt 251.1 lb

## 2011-11-09 DIAGNOSIS — J309 Allergic rhinitis, unspecified: Secondary | ICD-10-CM

## 2011-11-09 DIAGNOSIS — J039 Acute tonsillitis, unspecified: Secondary | ICD-10-CM

## 2011-11-09 MED ORDER — LIDOCAINE VISCOUS 2 % MT SOLN: 10.0000 mL | OROMUCOSAL | Status: AC | PRN

## 2011-11-09 MED ORDER — AMOXICILLIN-POT CLAVULANATE 875-125 MG PO TABS: 1.0000 | ORAL_TABLET | Freq: Two times a day (BID) | ORAL | Status: AC

## 2011-11-09 MED ORDER — LORATADINE 10 MG PO TABS: 10.0000 mg | ORAL_TABLET | Freq: Every day | ORAL | Status: DC | PRN

## 2011-11-09 NOTE — Progress Notes (Signed)
  Subjective:    Patient ID: Edward Reid, male    DOB: 1966/01/22, 46 y.o.   MRN: [id number]  HPI  Complains of right-sided sore throat Onset 8 days ago, progressively worse Pain exacerbated by swallowing, force of sneezing and cough No neck pain or exacerbation with twisting, flexion or extension activity Low-grade fever at onset, but none at this time Symptoms not improved with over-the-counter ibuprofen Denies exposure to strep Also complains of allergy flare with frequent sneezing and rhinorrhea x3 days  Past Medical History  Diagnosis Date  . ALLERGIC RHINITIS   . FASCIITIS, PLANTAR   . HYPERCHOLESTEROLEMIA   . HYPERLIPIDEMIA   . HYPERTENSION   . INTERNAL HEMORRHOIDS     Review of Systems  Constitutional: Negative for chills, appetite change, fatigue and unexpected weight change.  HENT: Positive for sore throat, rhinorrhea, sneezing, trouble swallowing and postnasal drip. Negative for hearing loss, nosebleeds, congestion, facial swelling, neck pain, dental problem, voice change, sinus pressure and tinnitus.   Eyes: Negative for pain and discharge.  Respiratory: Negative for chest tightness and shortness of breath.   Cardiovascular: Negative for chest pain, palpitations and leg swelling.       Objective:   Physical Exam BP 118/62  Pulse 83  Temp(Src) 98.5 F (36.9 C) (Oral)  Ht 6' 5.5" (1.969 m)  Wt 251 lb 1.9 oz (113.907 kg)  BMI 29.40 kg/m2  SpO2 97% Wt Readings from Last 3 Encounters:  11/09/11 251 lb 1.9 oz (113.907 kg)  09/29/10 246 lb 8 oz (111.812 kg)  04/06/10 239 lb 4 oz (108.523 kg)   Constitutional:  He appears well-developed and well-nourished. No distress.  HENT: NCAT, sinus nontender to palpation, B TMs clear without erythema or effusion, right tonsillar enlargement without evidence of exudate or abscess Neck: Normal range of motion. Neck supple. Mild right-sided lymphadenopathy. No JVD present. No thyromegaly present.  Cardiovascular: Normal rate,  regular rhythm and normal heart sounds.  No murmur heard. no BLE edema Pulmonary/Chest: Effort normal and breath sounds normal. No respiratory distress. no wheezes.  Neurological: he is alert and oriented to person, place, and time. No cranial nerve deficit. Coordination normal.  Skin: Skin is warm and dry.  No erythema or ulceration.  Psychiatric: he has a normal mood and affect. behavior is normal. Judgment and thought content normal.   Lab Results  Component Value Date   WBC 8.3 09/11/2010   HGB 14.8 09/11/2010   HCT 44.9 09/11/2010   PLT 204.0 09/11/2010   GLUCOSE 92 09/11/2010   CHOL 239* 09/11/2010   TRIG 134.0 09/11/2010   HDL 47.20 09/11/2010   LDLDIRECT 177.9 09/11/2010   ALT 27 09/11/2010   AST 21 09/11/2010   NA 138 09/11/2010   K 4.3 09/11/2010   CL 104 09/11/2010   CREATININE 1.0 09/11/2010   BUN 11 09/11/2010   CO2 29 09/11/2010   TSH 1.37 09/11/2010   PSA 1.32 09/11/2010        Assessment & Plan:  sore throat - R tonsillitis on exam allergic rhinitis    Augmentin x 10d -  Viscous lidocaine for pain relief claritin daily x 2 weeks Work excuse for next 24 hours to allow 72 hour rest and recovery

## 2011-11-09 NOTE — Patient Instructions (Signed)
It was good to see you today. Augmentin antibiotics, and viscous lidocaine to numb throat - Your prescription(s) have been submitted to your pharmacy. Please take as directed and contact our office if you believe you are having problem(s) with the medication(s). Take Claritin daily for next 10 days, then as needed for allergy/sneezing symptoms Call if symptoms unimproved in next 7-10 days, sooner if worse Work note provided today  Tonsillitis Tonsils are lumps of lymphoid tissues at the back of the throat. Each tonsil has 20 crevices (crypts). Tonsils help fight nose and throat infections and keep infection from spreading to other parts of the body for the first 18 months of life. Tonsillitis is an infection of the throat that causes the tonsils to become red, tender, and swollen. CAUSES Sudden and, if treated, temporary (acute) tonsillitis is usually caused by infection with streptococcal bacteria. Long lasting (chronic) tonsillitis occurs when the crypts of the tonsils become filled with pieces of food and bacteria, which makes it easy for the tonsils to become constantly infected. SYMPTOMS   Symptoms of tonsillitis include:  A sore throat.   White patches on the tonsils.   Fever.   Tiredness.  DIAGNOSIS Tonsillitis can be diagnosed through a physical exam. Diagnosis can be confirmed with the results of lab tests, including a throat culture. TREATMENT   The goals of tonsillitis treatment include the reduction of the severity and duration of symptoms, prevention of associated conditions, and prevention of disease transmission. Tonsillitis caused by bacteria can be treated with antibiotics. Usually, treatment with antibiotics is started before the cause of the tonsillitis is known. However, if it is determined that the cause is not bacterial, antibiotics will not treat the tonsillitis. If attacks of tonsillitis are severe and frequent, your caregiver may recommend surgery to remove the tonsils  (tonsillectomy). HOME CARE INSTRUCTIONS    Rest as much as possible and get plenty of sleep.   Drink plenty of fluids. While the throat is very sore, eat soft foods or liquids, such as sherbet, soups, or instant breakfast drinks.   Eat frozen ice pops.   Older children and adults may gargle with a warm or cold liquid to help soothe the throat. Mix 1 teaspoon of salt in 1 cup of water.   Other family members who also develop a sore throat or fever should have a medical exam or throat culture.   Only take over-the-counter or prescription medicines for pain, discomfort, or fever as directed by your caregiver.   If you are given antibiotics, take them as directed. Finish them even if you start to feel better.  SEEK MEDICAL CARE IF:    Your baby is older than 3 months with a rectal temperature of 100.5 F (38.1 C) or higher for more than 1 day.   Large, tender lumps develop in your neck.   A rash develops.   Green, yellow-brown, or bloody substance is coughed up.   You are unable to swallow liquids or food for 24 hours.   Your child is unable to swallow food or liquids for 12 hours.  SEEK IMMEDIATE MEDICAL CARE IF:    You develop any new symptoms such as vomiting, severe headache, stiff neck, chest pain, or trouble breathing or swallowing.   You have severe throat pain along with drooling or voice changes.   You have severe pain, unrelieved with recommended medications.   You are unable to fully open the mouth.   You develop redness, swelling, or severe pain anywhere  in the neck.   You have a fever.   Your baby is older than 3 months with a rectal temperature of 102 F (38.9 C) or higher.   Your baby is 65 months old or younger with a rectal temperature of 100.4 F (38 C) or higher.  MAKE SURE YOU:    Understand these instructions.   Will watch your condition.   Will get help right away if you are not doing well or get worse.  Document Released: 05/30/2005 Document  Revised: 08/09/2011 Document Reviewed: 10/26/2010 [facility] Patient Information 2012 [place], [place].

## 2011-11-21 ENCOUNTER — Telehealth: Payer: Self-pay | Admitting: Internal Medicine

## 2011-11-21 NOTE — Telephone Encounter (Signed)
Called left message to call back 

## 2011-11-21 NOTE — Telephone Encounter (Signed)
HAS BEEN ON ANTIBIOTICS SINCE MARCH 8.  HE HAS 3 TABLETS LEFT. HE STILL HAS THE SORE THROAT.  IT IS HARD TO SWOLLOW AFTER HE SNEEZES.  HURTS TO SNEEZE.

## 2011-11-21 NOTE — Telephone Encounter (Signed)
Possibly could be allergies as well, and now is the season  Please consider OV here, or to UC or ER sooner if has fever, cough, sob or other worsening symtpoms

## 2011-11-22 NOTE — Telephone Encounter (Signed)
Called left message to call back 

## 2011-11-23 NOTE — Telephone Encounter (Signed)
Called the patient left detailed message of MD's response 

## 2011-12-12 ENCOUNTER — Other Ambulatory Visit: Payer: Self-pay | Admitting: Internal Medicine

## 2012-01-16 ENCOUNTER — Encounter: Payer: Self-pay | Admitting: Internal Medicine

## 2012-01-16 DIAGNOSIS — Z0000 Encounter for general adult medical examination without abnormal findings: Secondary | ICD-10-CM

## 2012-01-16 DIAGNOSIS — N529 Male erectile dysfunction, unspecified: Secondary | ICD-10-CM | POA: Insufficient documentation

## 2012-01-16 DIAGNOSIS — Z0001 Encounter for general adult medical examination with abnormal findings: Secondary | ICD-10-CM | POA: Insufficient documentation

## 2012-01-18 ENCOUNTER — Encounter: Payer: Self-pay | Admitting: Internal Medicine

## 2012-01-18 ENCOUNTER — Ambulatory Visit (INDEPENDENT_AMBULATORY_CARE_PROVIDER_SITE_OTHER): Payer: BC Managed Care – PPO | Admitting: Internal Medicine

## 2012-01-18 VITALS — BP 122/72 | HR 78 | Temp 97.6°F | Ht 77.5 in | Wt 246.0 lb

## 2012-01-18 DIAGNOSIS — J309 Allergic rhinitis, unspecified: Secondary | ICD-10-CM

## 2012-01-18 DIAGNOSIS — R062 Wheezing: Secondary | ICD-10-CM

## 2012-01-18 DIAGNOSIS — I10 Essential (primary) hypertension: Secondary | ICD-10-CM

## 2012-01-18 DIAGNOSIS — Z0000 Encounter for general adult medical examination without abnormal findings: Secondary | ICD-10-CM

## 2012-01-18 MED ORDER — PREDNISONE 10 MG PO TABS: ORAL_TABLET | ORAL | Status: DC

## 2012-01-18 MED ORDER — FEXOFENADINE HCL 180 MG PO TABS: 180.0000 mg | ORAL_TABLET | Freq: Every day | ORAL | Status: DC

## 2012-01-18 MED ORDER — METHYLPREDNISOLONE ACETATE 80 MG/ML IJ SUSP
120.0000 mg | Freq: Once | INTRAMUSCULAR | Status: AC
  Administered 2012-01-18: 120 mg via INTRAMUSCULAR

## 2012-01-18 MED ORDER — BUDESONIDE-FORMOTEROL FUMARATE 160-4.5 MCG/ACT IN AERO: 2.0000 | INHALATION_SPRAY | Freq: Two times a day (BID) | RESPIRATORY_TRACT | Status: DC

## 2012-01-18 NOTE — Assessment & Plan Note (Signed)
C/w mild to mod prob seasonal asthma - for depomedrol IM, predpack, sample symbicort asd, and rx for same if needed

## 2012-01-18 NOTE — Assessment & Plan Note (Signed)
/  Mild to mod, for allegra prn,  to f/u any worsening symptoms or concerns 

## 2012-01-18 NOTE — Progress Notes (Signed)
  Subjective:    Patient ID: Edward Reid, male    DOB: [birth year], 46 y.o.   MRN: [id number]  HPI  Here after recent eval and tx with antibx per Dr Felicity Coyer, now with 2-3 wks ongoing nasal allergy symptoms with clear congestion, itch and sneeze, without fever, pain, ST, but with also cough and wheezing worse in the past 2 days with nighttime awakenings.  Pt denies fever, wt loss, night sweats, loss of appetite, or other constitutional symptoms  Pt denies new neurological symptoms such as new headache, or facial or extremity weakness or numbness   Pt denies polydipsia, polyuria. No other new complaints Past Medical History  Diagnosis Date  . ALLERGIC RHINITIS   . FASCIITIS, PLANTAR   . HYPERCHOLESTEROLEMIA   . HYPERLIPIDEMIA   . HYPERTENSION   . INTERNAL HEMORRHOIDS   . Erectile dysfunction    Past Surgical History  Procedure Date  . Mass excision from left forearm     lipoma  . Facial fracture surgery     reports that he has never smoked. He has never used smokeless tobacco. He reports that he does not drink alcohol or use illicit drugs. family history includes Coronary artery disease in his father and mother; Diabetes in his mother; Lung cancer in his father; and Prostate cancer in his father. No Known Allergies Current Outpatient Prescriptions on File Prior to Visit  Medication Sig Dispense Refill  . budesonide-formoterol (SYMBICORT) 160-4.5 MCG/ACT inhaler Inhale 2 puffs into the lungs 2 (two) times daily.  1 Inhaler  3  . fexofenadine (ALLEGRA) 180 MG tablet Take 1 tablet (180 mg total) by mouth daily.  90 tablet  3   No current facility-administered medications on file prior to visit.   Review of Systems Review of Systems  Constitutional: Negative for diaphoresis and unexpected weight change.  HENT: Negative for drooling and tinnitus.   Eyes: Negative for photophobia and visual disturbance.  Respiratory: Negative for choking and stridor.   Gastrointestinal: Negative for  vomiting and blood in stool.  Genitourinary: Negative for hematuria and decreased urine volume.  Musculoskeletal: Negative for gait problem.  Skin: Negative for color change and wound.  Neurological: Negative for tremors and numbness.  Psychiatric/Behavioral: Negative for decreased concentration. The patient is not hyperactive.      Objective:   Physical Exam BP 122/72  Pulse 78  Temp(Src) 97.6 F (36.4 C) (Oral)  Ht 6' 5.5" (1.969 m)  Wt 246 lb (111.585 kg)  BMI 28.80 kg/m2  SpO2 97% Physical Exam  VS noted, not ill appearing Constitutional: Pt appears well-developed and well-nourished.  HENT: Head: Normocephalic.  Right Ear: External ear normal.  Left Ear: External ear normal.  Bilat tm's mild erythema.  Sinus nontender.  Pharynx mild erythema Eyes: Conjunctivae and EOM are normal. Pupils are equal, round, and reactive to light.  Neck: Normal range of motion. Neck supple.  Cardiovascular: Normal rate and regular rhythm.   Pulmonary/Chest: Effort normal and breath sounds midl decreased with mild bilat wheeze Neurological: Pt is alert. Not confused Skin: Skin is warm. No erythema.  Psychiatric: Pt behavior is normal. Thought content normal.     Assessment & Plan:

## 2012-01-18 NOTE — Assessment & Plan Note (Signed)
stable overall by hx and exam, most recent data reviewed with pt, and pt to continue medical treatment as before BP Readings from Last 3 Encounters:  01/18/12 122/72  11/09/11 118/62  09/29/10 118/80

## 2012-01-18 NOTE — Patient Instructions (Addendum)
You had the steroid shot today Take all new medications as prescribed - the symbicort at 2 puffs twice per day, and the prednisone, as well as the allegra for allergies OK to stop the claritin No further antibiotic needed at this time Please return in 6 mo with Lab testing done 3-5 days before

## 2012-04-22 ENCOUNTER — Telehealth: Payer: Self-pay

## 2012-04-22 NOTE — Telephone Encounter (Signed)
Ok with me - to robin to handle 

## 2012-04-22 NOTE — Telephone Encounter (Signed)
Called left detailed message on both numbers letter requested has been completed and mailed to patients home per pt. Request.

## 2012-04-22 NOTE — Telephone Encounter (Signed)
The patient would like a work note for 01/18/12 OV and 2 days out of work thereafter due to illness. Please advise

## 2013-04-09 ENCOUNTER — Encounter: Payer: Self-pay | Admitting: Internal Medicine

## 2013-04-09 ENCOUNTER — Ambulatory Visit (INDEPENDENT_AMBULATORY_CARE_PROVIDER_SITE_OTHER): Payer: BC Managed Care – PPO | Admitting: Internal Medicine

## 2013-04-09 ENCOUNTER — Other Ambulatory Visit (INDEPENDENT_AMBULATORY_CARE_PROVIDER_SITE_OTHER): Payer: BC Managed Care – PPO

## 2013-04-09 VITALS — BP 118/64 | HR 104 | Temp 98.3°F | Ht 77.0 in | Wt 223.2 lb

## 2013-04-09 DIAGNOSIS — Z0000 Encounter for general adult medical examination without abnormal findings: Secondary | ICD-10-CM

## 2013-04-09 DIAGNOSIS — N529 Male erectile dysfunction, unspecified: Secondary | ICD-10-CM

## 2013-04-09 DIAGNOSIS — E7800 Pure hypercholesterolemia, unspecified: Secondary | ICD-10-CM

## 2013-04-09 LAB — HEPATIC FUNCTION PANEL
ALT: 21 U/L (ref 0–53)
Bilirubin, Direct: 0.1 mg/dL (ref 0.0–0.3)
Total Bilirubin: 0.9 mg/dL (ref 0.3–1.2)

## 2013-04-09 LAB — CBC WITH DIFFERENTIAL/PLATELET
Basophils Relative: 0.3 % (ref 0.0–3.0)
Eosinophils Absolute: 0.1 10*3/uL (ref 0.0–0.7)
HCT: 44.9 % (ref 39.0–52.0)
Lymphs Abs: 2.6 10*3/uL (ref 0.7–4.0)
MCHC: 32.9 g/dL (ref 30.0–36.0)
MCV: 84.6 fl (ref 78.0–100.0)
Monocytes Absolute: 0.7 10*3/uL (ref 0.1–1.0)
Neutro Abs: 5.2 10*3/uL (ref 1.4–7.7)
Neutrophils Relative %: 59.8 % (ref 43.0–77.0)
RBC: 5.31 Mil/uL (ref 4.22–5.81)

## 2013-04-09 LAB — LIPID PANEL
Cholesterol: 234 mg/dL — ABNORMAL HIGH (ref 0–200)
HDL: 47.9 mg/dL (ref >39.00)
Total CHOL/HDL Ratio: 5
VLDL: 24.6 mg/dL (ref 0.0–40.0)

## 2013-04-09 LAB — URINALYSIS, ROUTINE W REFLEX MICROSCOPIC
Bilirubin Urine: NEGATIVE
Hgb urine dipstick: NEGATIVE
Ketones, ur: NEGATIVE
Renal Epithel, UA: NONE SEEN
Total Protein, Urine: NEGATIVE
Urine Glucose: NEGATIVE
pH: 5.5 (ref 5.0–8.0)

## 2013-04-09 LAB — BASIC METABOLIC PANEL
BUN: 12 mg/dL (ref 6–23)
Calcium: 9.3 mg/dL (ref 8.4–10.5)
Chloride: 108 mEq/L (ref 96–112)
Creatinine, Ser: 1.2 mg/dL (ref 0.4–1.5)
GFR: 82.77 mL/min (ref >60.00)

## 2013-04-09 LAB — PSA: PSA: 1.77 ng/mL (ref 0.10–4.00)

## 2013-04-09 LAB — LDL CHOLESTEROL, DIRECT: Direct LDL: 164.5 mg/dL

## 2013-04-09 MED ORDER — TADALAFIL 20 MG PO TABS: 20.0000 mg | ORAL_TABLET | Freq: Every day | ORAL | Status: DC | PRN

## 2013-04-09 NOTE — Patient Instructions (Signed)
Your EKG was OK today Please take all new medication as prescribed Please continue all other medications as before, and refills have been done if requested. Please have the pharmacy call with any other refills you may need. Please continue your efforts at being more active, low cholesterol diet, and weight control. You are otherwise up to date with prevention measures today. Please go to the LAB in the Basement (turn left off the elevator) for the tests to be done today You will be contacted by phone if any changes need to be made immediately.  Otherwise, you will receive a letter about your results with an explanation, but please check with MyChart first.  Please remember to sign up for My Chart if you have not done so, as this will be important to you in the future with finding out test results, communicating by private email, and scheduling acute appointments online when needed.  Please return in 1 year for your yearly visit, or sooner if needed, with Lab testing done 3-5 days before

## 2013-04-09 NOTE — Assessment & Plan Note (Signed)
Ok for cialis prn 

## 2013-04-09 NOTE — Assessment & Plan Note (Signed)

## 2013-04-09 NOTE — Progress Notes (Addendum)
Subjective:    Patient ID: Edward Reid, male    DOB: [birth year], 47 y.o.   MRN: [id number]  HPI Here for wellness and f/u;  Overall doing ok;  Pt denies CP, worsening SOB, DOE, wheezing, orthopnea, PND, worsening LE edema, palpitations, dizziness or syncope.  Pt denies neurological change such as new headache, facial or extremity weakness.  Pt denies polydipsia, polyuria, or low sugar symptoms. Pt states overall good compliance with treatment and medications, good tolerability, and has been trying to follow lower cholesterol diet.  Pt denies worsening depressive symptoms, suicidal ideation or panic. No fever, night sweats, wt loss, loss of appetite, or other constitutional symptoms.  Pt states good ability with ADL's, has low fall risk, home safety reviewed and adequate, no other significant changes in hearing or vision, and only occasionally active with exercise.  No acute complaints, except having worsening ED symtpoms over the past 6 mo.  Has successfully lost wt from 251 to 223 over 18 mo.  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 ALLERGIC RHINITIS   . FASCIITIS, PLANTAR   . HYPERCHOLESTEROLEMIA   . HYPERLIPIDEMIA   . HYPERTENSION   . INTERNAL HEMORRHOIDS   . Erectile dysfunction    Past Surgical History  Procedure Laterality Date  . Mass excision from left forearm      lipoma  . Facial fracture surgery      reports that he has never smoked. He has never used smokeless tobacco. He reports that he does not drink alcohol or use illicit drugs. family history includes Coronary artery disease in his father and mother; Diabetes in his mother; Lung cancer in his father; and Prostate cancer in his father. No Known Allergies No current outpatient prescriptions on file prior to visit.   No current facility-administered medications on file prior to visit.   Review of Systems Constitutional: Negative for diaphoresis, activity change,  appetite change or unexpected weight change.  HENT: Negative for hearing loss, ear pain, facial swelling, mouth sores and neck stiffness.   Eyes: Negative for pain, redness and visual disturbance.  Respiratory: Negative for shortness of breath and wheezing.   Cardiovascular: Negative for chest pain and palpitations.  Gastrointestinal: Negative for diarrhea, blood in stool, abdominal distention or other pain Genitourinary: Negative for hematuria, flank pain or change in urine volume.  Musculoskeletal: Negative for myalgias and joint swelling.  Skin: Negative for color change and wound.  Neurological: Negative for syncope and numbness. other than noted Hematological: Negative for adenopathy.  Psychiatric/Behavioral: Negative for hallucinations, self-injury, decreased concentration and agitation.      Objective:   Physical Exam BP 118/64  Pulse 104  Temp(Src) 98.3 F (36.8 C) (Oral)  Ht 6\' 5"  (1.956 m)  Wt 223 lb 4 oz (101.266 kg)  BMI 26.47 kg/m2  SpO2 95% VS noted,  Constitutional: Pt is oriented to person, place, and time. Appears well-developed and well-nourished.  Head: Normocephalic and atraumatic.  Right Ear: External ear normal.  Left Ear: External ear normal.  Nose: Nose normal.  Mouth/Throat: Oropharynx is clear and moist.  Eyes: Conjunctivae and EOM are normal. Pupils are equal, round, and reactive to light.  Neck: Normal range of motion. Neck supple. No JVD present. No tracheal deviation present.  Cardiovascular: Normal rate, regular rhythm, normal heart sounds and intact distal pulses.   Pulmonary/Chest: Effort normal and breath sounds normal.  Abdominal: Soft. Bowel sounds are normal. There is no tenderness. No HSM  Musculoskeletal: Normal range of motion. Exhibits no edema.  Lymphadenopathy:  Has no cervical adenopathy.  Neurological: Pt is alert and oriented to person, place, and time. Pt has normal reflexes. No cranial nerve deficit.  Skin: Skin is warm and dry.  No rash noted.  Psychiatric:  Has  normal mood and affect. Behavior is normal.      Assessment & Plan:

## 2013-10-14 ENCOUNTER — Telehealth: Payer: Self-pay | Admitting: *Deleted

## 2013-10-14 NOTE — Telephone Encounter (Signed)
Patient phoned and stated that prescription voucher for cialis was "out of date" when he attempted to use at pharmacy. Checked inventory, no available samples and all the vouchers were expired as of 08/2013.

## 2014-04-29 ENCOUNTER — Encounter: Payer: BC Managed Care – PPO | Admitting: Internal Medicine

## 2014-08-10 ENCOUNTER — Ambulatory Visit (INDEPENDENT_AMBULATORY_CARE_PROVIDER_SITE_OTHER): Payer: BC Managed Care – PPO | Admitting: Internal Medicine

## 2014-08-10 ENCOUNTER — Other Ambulatory Visit (INDEPENDENT_AMBULATORY_CARE_PROVIDER_SITE_OTHER): Payer: BC Managed Care – PPO

## 2014-08-10 ENCOUNTER — Encounter: Payer: Self-pay | Admitting: Internal Medicine

## 2014-08-10 VITALS — BP 132/78 | HR 75 | Temp 97.6°F | Ht 77.0 in | Wt 240.6 lb

## 2014-08-10 DIAGNOSIS — E7800 Pure hypercholesterolemia, unspecified: Secondary | ICD-10-CM

## 2014-08-10 DIAGNOSIS — I10 Essential (primary) hypertension: Secondary | ICD-10-CM

## 2014-08-10 DIAGNOSIS — L989 Disorder of the skin and subcutaneous tissue, unspecified: Secondary | ICD-10-CM

## 2014-08-10 DIAGNOSIS — Z23 Encounter for immunization: Secondary | ICD-10-CM

## 2014-08-10 DIAGNOSIS — Z0000 Encounter for general adult medical examination without abnormal findings: Secondary | ICD-10-CM

## 2014-08-10 LAB — BASIC METABOLIC PANEL
BUN: 15 mg/dL (ref 6–23)
CALCIUM: 9.3 mg/dL (ref 8.4–10.5)
CO2: 26 meq/L (ref 19–32)
Chloride: 105 mEq/L (ref 96–112)
Creatinine, Ser: 0.9 mg/dL (ref 0.4–1.5)
GFR: 111.86 mL/min (ref >60.00)
Glucose, Bld: 100 mg/dL — ABNORMAL HIGH (ref 70–99)
Potassium: 4.6 mEq/L (ref 3.5–5.1)
SODIUM: 139 meq/L (ref 135–145)

## 2014-08-10 LAB — PSA: PSA: 1.26 ng/mL (ref 0.10–4.00)

## 2014-08-10 LAB — URINALYSIS, ROUTINE W REFLEX MICROSCOPIC
BILIRUBIN URINE: NEGATIVE
Hgb urine dipstick: NEGATIVE
Ketones, ur: NEGATIVE
LEUKOCYTES UA: NEGATIVE
NITRITE: NEGATIVE
PH: 5.5 (ref 5.0–8.0)
Specific Gravity, Urine: >=1.03 — AB (ref 1.000–1.030)
Total Protein, Urine: NEGATIVE
Urine Glucose: NEGATIVE
Urobilinogen, UA: 0.2 (ref 0.0–1.0)

## 2014-08-10 LAB — CBC WITH DIFFERENTIAL/PLATELET
Basophils Absolute: 0 10*3/uL (ref 0.0–0.1)
Basophils Relative: 0.3 % (ref 0.0–3.0)
EOS PCT: 3.2 % (ref 0.0–5.0)
Eosinophils Absolute: 0.2 10*3/uL (ref 0.0–0.7)
HCT: 46.3 % (ref 39.0–52.0)
Hemoglobin: 15.4 g/dL (ref 13.0–17.0)
LYMPHS PCT: 32.2 % (ref 12.0–46.0)
Lymphs Abs: 2.4 10*3/uL (ref 0.7–4.0)
MCHC: 33.1 g/dL (ref 30.0–36.0)
MCV: 83.6 fl (ref 78.0–100.0)
MONO ABS: 0.7 10*3/uL (ref 0.1–1.0)
MONOS PCT: 10 % (ref 3.0–12.0)
NEUTROS PCT: 54.3 % (ref 43.0–77.0)
Neutro Abs: 4 10*3/uL (ref 1.4–7.7)
Platelets: 212 10*3/uL (ref 150.0–400.0)
RBC: 5.54 Mil/uL (ref 4.22–5.81)
RDW: 13.8 % (ref 11.5–15.5)
WBC: 7.4 10*3/uL (ref 4.0–10.5)

## 2014-08-10 LAB — LIPID PANEL
Cholesterol: 231 mg/dL — ABNORMAL HIGH (ref 0–200)
HDL: 45.7 mg/dL (ref >39.00)
LDL CALC: 157 mg/dL — AB (ref 0–99)
NonHDL: 185.3
Total CHOL/HDL Ratio: 5
Triglycerides: 143 mg/dL (ref 0.0–149.0)
VLDL: 28.6 mg/dL (ref 0.0–40.0)

## 2014-08-10 LAB — HEPATIC FUNCTION PANEL
ALK PHOS: 54 U/L (ref 39–117)
ALT: 23 U/L (ref 0–53)
AST: 21 U/L (ref 0–37)
Albumin: 4.1 g/dL (ref 3.5–5.2)
Bilirubin, Direct: 0.1 mg/dL (ref 0.0–0.3)
Total Bilirubin: 0.6 mg/dL (ref 0.2–1.2)
Total Protein: 7.2 g/dL (ref 6.0–8.3)

## 2014-08-10 LAB — TSH: TSH: 1.01 u[IU]/mL (ref 0.35–4.50)

## 2014-08-10 MED ORDER — ASPIRIN EC 81 MG PO TBEC: 81.0000 mg | DELAYED_RELEASE_TABLET | Freq: Every day | ORAL | Status: AC

## 2014-08-10 MED ORDER — TADALAFIL 20 MG PO TABS: 20.0000 mg | ORAL_TABLET | Freq: Every day | ORAL | Status: DC | PRN

## 2014-08-10 NOTE — Assessment & Plan Note (Signed)
Lab Results  Component Value Date   CHOL 234* 04/09/2013   HDL 47.90 04/09/2013   LDLDIRECT 164.5 04/09/2013   TRIG 123.0 04/09/2013   CHOLHDL 5 04/09/2013   For lower chol diet, re-check labs, for statin for goal ldl < 100

## 2014-08-10 NOTE — Assessment & Plan Note (Signed)
stable overall by history and exam, recent data reviewed with pt, and pt to continue medical treatment as before,  to f/u any worsening symptoms or concerns BP Readings from Last 3 Encounters:  08/10/14 132/78  04/09/13 118/64  01/18/12 122/72

## 2014-08-10 NOTE — Assessment & Plan Note (Signed)
Right neck, for derm referral

## 2014-08-10 NOTE — Addendum Note (Signed)
Addended by: Marlene LardMILLER, Larell Baney M on: 08/10/2014 10:33 AM   Modules accepted: Orders, SmartSet

## 2014-08-10 NOTE — Patient Instructions (Addendum)
You had the flu shot today  Please continue all other medications as before, and refills have been done if requested.  Please have the pharmacy call with any other refills you may need.  Please continue your efforts at being more active, low cholesterol diet, and weight control.  You are otherwise up to date with prevention measures today.  Please keep your appointments with your specialists as you may have planned  You will be contacted regarding the referral for: colonoscopy with Dr [email or web address], and dermatology Please go to the LAB in the Basement (turn left off the elevator) for the tests to be done today  You will be contacted by phone if any changes need to be made immediately.  Otherwise, you will receive a letter about your results with an explanation, but please check with MyChart first.  Please remember to sign up for MyChart if you have not done so, as this will be important to you in the future with finding out test results, communicating by private email, and scheduling acute appointments online when needed.  Please return in 1 year for your yearly visit, or sooner if needed, with Lab testing done 3-5 days before

## 2014-08-10 NOTE — Assessment & Plan Note (Signed)

## 2014-08-10 NOTE — Progress Notes (Signed)
Pre visit review using our clinic review tool, if applicable. No additional management support is needed unless otherwise documented below in the visit note. 

## 2014-08-10 NOTE — Progress Notes (Signed)
Subjective:    Patient ID: Edward Reid, male    DOB: 1965/10/25, 48 y.o.   MRN: [id number]  HPI Here for wellness and f/u;  Overall doing ok;  Pt denies CP, worsening SOB, DOE, wheezing, orthopnea, PND, worsening LE edema, palpitations, dizziness or syncope.  Pt denies neurological change such as new headache, facial or extremity weakness.  Pt denies polydipsia, polyuria, or low sugar symptoms. Pt states overall good compliance with treatment and medications, good tolerability, and has been trying to follow lower cholesterol diet.  Pt denies worsening depressive symptoms, suicidal ideation or panic. No fever, night sweats, wt loss, loss of appetite, or other constitutional symptoms.  Pt states good ability with ADL's, has low fall risk, home safety reviewed and adequate, no other significant changes in hearing or vision, and only occasionally active with exercise, has been less active due to work schedule, back on second shift.   Wt Readings from Last 3 Encounters:  08/10/14 240 lb 9.6 oz (109.135 kg)  04/09/13 223 lb 4 oz (101.266 kg)  01/18/12 246 lb (111.585 kg)   Past Medical History  Diagnosis Date  . ALLERGIC RHINITIS   . FASCIITIS, PLANTAR   . HYPERCHOLESTEROLEMIA   . HYPERLIPIDEMIA   . HYPERTENSION   . INTERNAL HEMORRHOIDS   . Erectile dysfunction    Past Surgical History  Procedure Laterality Date  . Mass excision from left forearm      lipoma  . Facial fracture surgery      reports that he has never smoked. He has never used smokeless tobacco. He reports that he does not drink alcohol or use illicit drugs. family history includes Coronary artery disease in his father and mother; Diabetes in his mother; Lung cancer in his father; Prostate cancer in his father. No Known Allergies Current Outpatient Prescriptions on File Prior to Visit  Medication Sig Dispense Refill  . tadalafil (CIALIS) 20 MG tablet Take 1 tablet (20 mg total) by mouth daily as needed for erectile  dysfunction. (Patient not taking: Reported on 08/10/2014) 10 tablet 11   No current facility-administered medications on file prior to visit.   Review of Systems Constitutional: Negative for increased diaphoresis, other activity, appetite or other siginficant weight change  HENT: Negative for worsening hearing loss, ear pain, facial swelling, mouth sores and neck stiffness.   Eyes: Negative for other worsening pain, redness or visual disturbance.  Respiratory: Negative for shortness of breath and wheezing.   Cardiovascular: Negative for chest pain and palpitations.  Gastrointestinal: Negative for diarrhea, blood in stool, abdominal distention or other pain Genitourinary: Negative for hematuria, flank pain or change in urine volume.  Musculoskeletal: Negative for myalgias or other joint complaints.  Skin: Negative for color change and wound.  Neurological: Negative for syncope and numbness. other than noted Hematological: Negative for adenopathy. or other swelling Psychiatric/Behavioral: Negative for hallucinations, self-injury, decreased concentration or other worsening agitation.      Objective:   Physical Exam BP 132/78 mmHg  Pulse 75  Temp(Src) 97.6 F (36.4 C) (Oral)  Ht 6\' 5"  (1.956 m)  Wt 240 lb 9.6 oz (109.135 kg)  BMI 28.53 kg/m2  SpO2 95% VS noted,  Constitutional: Pt is oriented to person, place, and time. Appears well-developed and well-nourished.  Head: Normocephalic and atraumatic.  Right Ear: External ear normal.  Left Ear: External ear normal.  Nose: Nose normal.  Mouth/Throat: Oropharynx is clear and moist.  Eyes: Conjunctivae and EOM are normal. Pupils are equal, round, and reactive  to light.  Neck: Normal range of motion. Neck supple. No JVD present. No tracheal deviation present.  Cardiovascular: Normal rate, regular rhythm, normal heart sounds and intact distal pulses.   Pulmonary/Chest: Effort normal and breath sounds without rales or wheezing  Abdominal:  Soft. Bowel sounds are normal. NT. No HSM  Musculoskeletal: Normal range of motion. Exhibits no edema.  Lymphadenopathy:  Has no cervical adenopathy.  Neurological: Pt is alert and oriented to person, place, and time. Pt has normal reflexes. No cranial nerve deficit. Motor grossly intact Skin: Skin is warm and dry. No rash noted. Has lesion to right neck slight raised - ? cystic Psychiatric:  Has normal mood and affect. Behavior is normal.      Assessment & Plan:

## 2014-08-11 ENCOUNTER — Other Ambulatory Visit: Payer: Self-pay | Admitting: Internal Medicine

## 2014-08-11 ENCOUNTER — Encounter: Payer: Self-pay | Admitting: Internal Medicine

## 2014-08-11 MED ORDER — ATORVASTATIN CALCIUM 10 MG PO TABS: 10.0000 mg | ORAL_TABLET | Freq: Every day | ORAL | Status: DC

## 2014-08-12 ENCOUNTER — Encounter: Payer: Self-pay | Admitting: Gastroenterology

## 2014-10-04 ENCOUNTER — Ambulatory Visit (AMBULATORY_SURGERY_CENTER): Payer: Self-pay | Admitting: *Deleted

## 2014-10-04 VITALS — Ht 77.5 in | Wt 245.5 lb

## 2014-10-04 DIAGNOSIS — Z1211 Encounter for screening for malignant neoplasm of colon: Secondary | ICD-10-CM

## 2014-10-04 MED ORDER — NA SULFATE-K SULFATE-MG SULF 17.5-3.13-1.6 GM/177ML PO SOLN: 1.0000 | Freq: Once | ORAL | Status: DC

## 2014-10-04 NOTE — Progress Notes (Signed)
Denies allergies to eggs or soy products. Denies complications with sedation or anesthesia. Denies O2 use. Denies use of diet or weight loss medications.  Emmi instructions given for colonoscopy.  

## 2014-10-18 ENCOUNTER — Encounter: Payer: Self-pay | Admitting: Gastroenterology

## 2014-10-18 ENCOUNTER — Ambulatory Visit (AMBULATORY_SURGERY_CENTER): Payer: BLUE CROSS/BLUE SHIELD | Admitting: Gastroenterology

## 2014-10-18 VITALS — BP 128/81 | HR 74 | Temp 98.5°F | Resp 22 | Ht 77.0 in | Wt 240.0 lb

## 2014-10-18 DIAGNOSIS — Z1211 Encounter for screening for malignant neoplasm of colon: Secondary | ICD-10-CM

## 2014-10-18 MED ORDER — SODIUM CHLORIDE 0.9 % IV SOLN: 500.0000 mL | INTRAVENOUS | Status: DC

## 2014-10-18 NOTE — Op Note (Signed)
[facility] [address] [place], [id number]   COLONOSCOPY PROCEDURE REPORT  PATIENT: Edward Reid, Edward Reid  MR#: [id number] BIRTHDATE: 1966/01/30 , 48  yrs. old GENDER: male ENDOSCOPIST: Louis Meckelobert D Krissa Utke, MD REFERRED XB:JYNWGBY:James [name], M.D. PROCEDURE DATE:  10/18/2014 PROCEDURE:   Colonoscopy, diagnostic First Screening Colonoscopy - Avg.  risk and is 50 yrs.  old or older Yes.  Prior Negative Screening - Now for repeat screening. N/A  History of Adenoma - Now for follow-up colonoscopy & has been > or = to 3 yrs.  N/A  Polyps Removed Today? No.  Recommend repeat exam, <10 yrs? No. ASA CLASS:   Class II INDICATIONS:average risk patient for colon cancer. MEDICATIONS: Monitored anesthesia care and Propofol 320 mg IV  DESCRIPTION OF PROCEDURE:   After the risks benefits and alternatives of the procedure were thoroughly explained, informed consent was obtained.  The digital rectal exam revealed no abnormalities of the rectum.   The LB [id number]  endoscope was introduced through the anus and advanced to the ileum. No adverse events experienced.   The quality of the prep was excellent using Suprep  The instrument was then slowly withdrawn as the colon was fully examined.      COLON FINDINGS: A normal appearing cecum, ileocecal valve, and appendiceal orifice were identified.  The ascending, transverse, descending, sigmoid colon, and rectum appeared unremarkable. Retroflexed views revealed no abnormalities. The time to cecum=4 minutes 14 seconds.  Withdrawal time=8 minutes 01 seconds.  The scope was withdrawn and the procedure completed. COMPLICATIONS: There were no immediate complications.  ENDOSCOPIC IMPRESSION: Normal colonoscopy  RECOMMENDATIONS: Continue current colorectal screening recommendations for "routine risk" patients with a repeat colonoscopy in 10 years.  eSigned:  Louis Meckelobert D Summers Buendia, MD 10/18/2014 10:29 AM   cc:

## 2014-10-18 NOTE — Progress Notes (Signed)
A/ox3, pleased with MAC, report to RN 

## 2014-10-18 NOTE — Patient Instructions (Signed)

## 2014-10-18 NOTE — Progress Notes (Signed)
20 min recovery trial-Pt was in recovery nearly 20 min before Dr came in, pt also went to the bathroom several times when getting dressed to pass additional gas-adm

## 2014-10-19 ENCOUNTER — Telehealth: Payer: Self-pay | Admitting: *Deleted

## 2014-10-19 NOTE — Telephone Encounter (Signed)
  Follow up Call-  Call back number 10/18/2014  Post procedure Call Back phone  # [phone]  Permission to leave phone message Yes     Patient questions:  Message left too call us if necessary.

## 2015-02-15 ENCOUNTER — Encounter: Payer: Self-pay | Admitting: Internal Medicine

## 2015-02-15 ENCOUNTER — Ambulatory Visit (INDEPENDENT_AMBULATORY_CARE_PROVIDER_SITE_OTHER): Payer: BLUE CROSS/BLUE SHIELD | Admitting: Internal Medicine

## 2015-02-15 VITALS — BP 126/74 | HR 80 | Temp 98.3°F | Ht 77.0 in | Wt 249.0 lb

## 2015-02-15 DIAGNOSIS — J309 Allergic rhinitis, unspecified: Secondary | ICD-10-CM | POA: Diagnosis not present

## 2015-02-15 DIAGNOSIS — I10 Essential (primary) hypertension: Secondary | ICD-10-CM | POA: Diagnosis not present

## 2015-02-15 DIAGNOSIS — E7800 Pure hypercholesterolemia, unspecified: Secondary | ICD-10-CM

## 2015-02-15 MED ORDER — TRIAMCINOLONE ACETONIDE 55 MCG/ACT NA AERO: 2.0000 | INHALATION_SPRAY | Freq: Every day | NASAL | Status: DC

## 2015-02-15 MED ORDER — METHYLPREDNISOLONE ACETATE 80 MG/ML IJ SUSP
80.0000 mg | Freq: Once | INTRAMUSCULAR | Status: AC
  Administered 2015-02-15: 80 mg via INTRAMUSCULAR

## 2015-02-15 NOTE — Progress Notes (Signed)
Pre visit review using our clinic review tool, if applicable. No additional management support is needed unless otherwise documented below in the visit note. 

## 2015-02-15 NOTE — Assessment & Plan Note (Signed)
Mild to mod, for cont zyrtec, depomedrol IM, nasacort asd,  to f/u any worsening symptoms or concerns

## 2015-02-15 NOTE — Assessment & Plan Note (Signed)
stable overall by history and exam, recent data reviewed with pt, and pt to continue medical treatment as before,  to f/u any worsening symptoms or concerns BP Readings from Last 3 Encounters:  02/15/15 126/74  10/18/14 128/81  08/10/14 132/78

## 2015-02-15 NOTE — Progress Notes (Signed)
Subjective:    Patient ID: Edward Reid, male    DOB: [birth year], 49 y.o.   MRN: [id number]  HPI  Here for f/u -   Does have several wks ongoing nasal allergy symptoms with clearish congestion, itch and sneezing, without fever, pain, ST, cough, swelling or wheezing.  Zyrtec helps somewhat, still with ongoing congestion. Cannot smell or taste well. Pt denies chest pain, increased sob or doe, wheezing, orthopnea, PND, increased LE swelling, palpitations, dizziness or syncope.  Pt denies new neurological symptoms such as new headache, or facial or extremity weakness or numbness   Pt denies polydipsia, polyuria, Past Medical History  Diagnosis Date  . ALLERGIC RHINITIS   . FASCIITIS, PLANTAR   . HYPERCHOLESTEROLEMIA   . HYPERLIPIDEMIA   . HYPERTENSION   . INTERNAL HEMORRHOIDS   . Erectile dysfunction    Past Surgical History  Procedure Laterality Date  . Mass excision from left forearm      lipoma  . Facial fracture surgery      reports that he has never smoked. He has never used smokeless tobacco. He reports that he drinks about 1.2 oz of alcohol per week. He reports that he does not use illicit drugs. family history includes Coronary artery disease in his father and mother; Diabetes in his mother; Lung cancer in his father; Prostate cancer in his father. There is no history of Colon cancer, Esophageal cancer, Rectal cancer, or Stomach cancer. Allergies  Allergen Reactions  . Codeine Other (See Comments)    headache   Current Outpatient Prescriptions on File Prior to Visit  Medication Sig Dispense Refill  . aspirin EC 81 MG tablet Take 1 tablet (81 mg total) by mouth daily. 90 tablet 11  . atorvastatin (LIPITOR) 10 MG tablet Take 1 tablet (10 mg total) by mouth daily. 90 tablet 3  . NON FORMULARY Mucous relief pe    . tadalafil (CIALIS) 20 MG tablet Take 1 tablet (20 mg total) by mouth daily as needed for erectile dysfunction. (Patient not taking: Reported on 02/15/2015) 10 tablet 11     No current facility-administered medications on file prior to visit.   Review of Systems   Constitutional: Negative for unusual diaphoresis or night sweats HENT: Negative for ringing in ear or discharge Eyes: Negative for double vision or worsening visual disturbance.  Respiratory: Negative for choking and stridor.   Gastrointestinal: Negative for vomiting or other signifcant bowel change Genitourinary: Negative for hematuria or change in urine volume.  Musculoskeletal: Negative for other MSK pain or swelling Skin: Negative for color change and worsening wound.  Neurological: Negative for tremors and numbness other than noted  Psychiatric/Behavioral: Negative for decreased concentration or agitation other than above       Objective:   Physical Exam BP 126/74 mmHg  Pulse 80  Temp(Src) 98.3 F (36.8 C) (Oral)  Ht 6\' 5"  (1.956 m)  Wt 249 lb (112.946 kg)  BMI 29.52 kg/m2  SpO2 97% VS noted,  Constitutional: Pt appears in no significant distress HENT: Head: NCAT.  Right Ear: External ear normal.  Left Ear: External ear normal.  Eyes: . Pupils are equal, round, and reactive to light. Conjunctivae and EOM are normal Bilat tm's with mild erythema.  Max sinus areas non tender.  Pharynx with mild erythema, no exudate Neck: Normal range of motion. Neck supple.  Cardiovascular: Normal rate and regular rhythm.   Pulmonary/Chest: Effort normal and breath sounds without rales or wheezing.  Neurological: Pt is alert. Not confused ,  motor grossly intact Skin: Skin is warm. No rash, no LE edema Psychiatric: Pt behavior is normal. No agitation.      Assessment & Plan:

## 2015-02-15 NOTE — Assessment & Plan Note (Addendum)
Increased recently, admits to dietary excess, o/w stable overall by history and exam, recent data reviewed with pt, and pt to continue medical treatment as before - to re-start statin for now,  to f/u any worsening symptoms or concerns Lab Results  Component Value Date   LDLCALC 157* 08/10/2014

## 2015-02-15 NOTE — Patient Instructions (Addendum)
You had the steroid shot today  Please take all new medication as prescribed - the nasacort  Please continue all other medications as before, and refills have been done if requested.  Please have the pharmacy call with any other refills you may need.  Please continue your efforts at being more active, low cholesterol diet, and weight control.  Please keep your appointments with your specialists as you may have planned  Please go to the LAB in the Basement (turn left off the elevator) for the tests to be done today  You will be contacted by phone if any changes need to be made immediately.  Otherwise, you will receive a letter about your results with an explanation, but please check with MyChart first.  Please remember to sign up for MyChart if you have not done so, as this will be important to you in the future with finding out test results, communicating by private email, and scheduling acute appointments online when needed.  Please return in 6 months, or sooner if needed, with Lab testing done 3-5 days before

## 2015-08-11 ENCOUNTER — Encounter: Payer: Self-pay | Admitting: Internal Medicine

## 2015-08-11 ENCOUNTER — Ambulatory Visit (INDEPENDENT_AMBULATORY_CARE_PROVIDER_SITE_OTHER): Payer: BLUE CROSS/BLUE SHIELD | Admitting: Internal Medicine

## 2015-08-11 ENCOUNTER — Other Ambulatory Visit (INDEPENDENT_AMBULATORY_CARE_PROVIDER_SITE_OTHER): Payer: BLUE CROSS/BLUE SHIELD

## 2015-08-11 VITALS — BP 146/90 | HR 91 | Temp 97.6°F | Ht 77.0 in | Wt 255.0 lb

## 2015-08-11 DIAGNOSIS — I10 Essential (primary) hypertension: Secondary | ICD-10-CM | POA: Diagnosis not present

## 2015-08-11 DIAGNOSIS — R059 Cough, unspecified: Secondary | ICD-10-CM | POA: Insufficient documentation

## 2015-08-11 DIAGNOSIS — E7800 Pure hypercholesterolemia, unspecified: Secondary | ICD-10-CM | POA: Diagnosis not present

## 2015-08-11 DIAGNOSIS — Z0000 Encounter for general adult medical examination without abnormal findings: Secondary | ICD-10-CM | POA: Diagnosis not present

## 2015-08-11 DIAGNOSIS — R062 Wheezing: Secondary | ICD-10-CM | POA: Diagnosis not present

## 2015-08-11 DIAGNOSIS — R05 Cough: Secondary | ICD-10-CM

## 2015-08-11 LAB — URINALYSIS, ROUTINE W REFLEX MICROSCOPIC
BILIRUBIN URINE: NEGATIVE
KETONES UR: NEGATIVE
LEUKOCYTES UA: NEGATIVE
Nitrite: NEGATIVE
RBC / HPF: NONE SEEN (ref 0–?)
Specific Gravity, Urine: 1.025 (ref 1.000–1.030)
TOTAL PROTEIN, URINE-UPE24: NEGATIVE
UROBILINOGEN UA: 0.2 (ref 0.0–1.0)
Urine Glucose: NEGATIVE
WBC UA: NONE SEEN (ref 0–?)
pH: 5.5 (ref 5.0–8.0)

## 2015-08-11 LAB — BASIC METABOLIC PANEL
BUN: 15 mg/dL (ref 6–23)
CALCIUM: 9.3 mg/dL (ref 8.4–10.5)
CO2: 28 meq/L (ref 19–32)
CREATININE: 1.11 mg/dL (ref 0.40–1.50)
Chloride: 104 mEq/L (ref 96–112)
GFR: 89.71 mL/min (ref >60.00)
GLUCOSE: 104 mg/dL — AB (ref 70–99)
Potassium: 4.7 mEq/L (ref 3.5–5.1)
Sodium: 137 mEq/L (ref 135–145)

## 2015-08-11 LAB — CBC WITH DIFFERENTIAL/PLATELET
BASOS ABS: 0 10*3/uL (ref 0.0–0.1)
Basophils Relative: 0.5 % (ref 0.0–3.0)
EOS ABS: 0.3 10*3/uL (ref 0.0–0.7)
Eosinophils Relative: 2.9 % (ref 0.0–5.0)
HEMATOCRIT: 47.3 % (ref 39.0–52.0)
Hemoglobin: 15.7 g/dL (ref 13.0–17.0)
LYMPHS PCT: 18.7 % (ref 12.0–46.0)
Lymphs Abs: 2 10*3/uL (ref 0.7–4.0)
MCHC: 33.2 g/dL (ref 30.0–36.0)
MCV: 83.5 fl (ref 78.0–100.0)
MONOS PCT: 9.9 % (ref 3.0–12.0)
Monocytes Absolute: 1 10*3/uL (ref 0.1–1.0)
NEUTROS PCT: 68 % (ref 43.0–77.0)
Neutro Abs: 7.2 10*3/uL (ref 1.4–7.7)
Platelets: 218 10*3/uL (ref 150.0–400.0)
RBC: 5.66 Mil/uL (ref 4.22–5.81)
RDW: 13.8 % (ref 11.5–15.5)
WBC: 10.5 10*3/uL (ref 4.0–10.5)

## 2015-08-11 LAB — HEPATIC FUNCTION PANEL
ALBUMIN: 4.4 g/dL (ref 3.5–5.2)
ALT: 25 U/L (ref 0–53)
AST: 20 U/L (ref 0–37)
Alkaline Phosphatase: 58 U/L (ref 39–117)
Bilirubin, Direct: 0.1 mg/dL (ref 0.0–0.3)
TOTAL PROTEIN: 7.4 g/dL (ref 6.0–8.3)
Total Bilirubin: 0.4 mg/dL (ref 0.2–1.2)

## 2015-08-11 LAB — TSH: TSH: 1.02 u[IU]/mL (ref 0.35–4.50)

## 2015-08-11 LAB — LIPID PANEL
CHOL/HDL RATIO: 6
Cholesterol: 230 mg/dL — ABNORMAL HIGH (ref 0–200)
HDL: 41.3 mg/dL (ref >39.00)
LDL Cholesterol: 159 mg/dL — ABNORMAL HIGH (ref 0–99)
NONHDL: 188.5
Triglycerides: 147 mg/dL (ref 0.0–149.0)
VLDL: 29.4 mg/dL (ref 0.0–40.0)

## 2015-08-11 LAB — PSA: PSA: 1.86 ng/mL (ref 0.10–4.00)

## 2015-08-11 MED ORDER — PREDNISONE 10 MG PO TABS: 10.0000 mg | ORAL_TABLET | Freq: Every day | ORAL | Status: DC

## 2015-08-11 MED ORDER — PREDNISONE 10 MG PO TABS: ORAL_TABLET | ORAL | Status: DC

## 2015-08-11 MED ORDER — LEVOFLOXACIN 250 MG PO TABS: 250.0000 mg | ORAL_TABLET | Freq: Every day | ORAL | Status: DC

## 2015-08-11 MED ORDER — HYDROCODONE-HOMATROPINE 5-1.5 MG/5ML PO SYRP: 5.0000 mL | ORAL_SOLUTION | Freq: Four times a day (QID) | ORAL | Status: DC | PRN

## 2015-08-11 NOTE — Patient Instructions (Signed)
Please take all new medication as prescribed - the antibiotic, cough medicine, and prednisone  Please continue all other medications as before, and refills have been done if requested.  Please have the pharmacy call with any other refills you may need.  Please continue your efforts at being more active, low cholesterol diet, and weight control.  You are otherwise up to date with prevention measures today.  Please keep your appointments with your specialists as you may have planned  Please go to the LAB in the Basement (turn left off the elevator) for the tests to be done today  You will be contacted by phone if any changes need to be made immediately.  Otherwise, you will receive a letter about your results with an explanation, but please check with MyChart first.  Please remember to sign up for MyChart if you have not done so, as this will be important to you in the future with finding out test results, communicating by private email, and scheduling acute appointments online when needed.  Please return in 1 year for your yearly visit, or sooner if needed, with Lab testing done 3-5 days before  

## 2015-08-11 NOTE — Progress Notes (Signed)
Pre visit review using our clinic review tool, if applicable. No additional management support is needed unless otherwise documented below in the visit note. 

## 2015-08-11 NOTE — Progress Notes (Signed)
Subjective:    Patient ID: Edward Reid, male    DOB: 1965/11/09, 49 y.o.   MRN: [id number]  HPI  Here for wellness and f/u;  Overall doing ok;  Pt denies Chest pain, worsening SOB, DOE, wheezing, orthopnea, PND, worsening LE edema, palpitations, dizziness or syncope.  Pt denies neurological change such as new headache, facial or extremity weakness.  Pt denies polydipsia, polyuria, or low sugar symptoms. Pt states overall good compliance with treatment and medications, good tolerability, and has been trying to follow appropriate diet.  Pt denies worsening depressive symptoms, suicidal ideation or panic. No fever, night sweats, wt loss, loss of appetite, or other constitutional symptoms.  Pt states good ability with ADL's, has low fall risk, home safety reviewed and adequate, no other significant changes in hearing or vision, and only occasionally active with exercise.  Incidentally also with Here with acute onset mild to mod 2-3 days ST, HA, general weakness and malaise, with prod cough greenish sputum, but Pt denies chest pain, increased sob or doe, wheezing, orthopnea, PND, increased LE swelling, palpitations, dizziness or syncope, except for onset mild wheeze and sob last PM.  BP has also been elevated recently severa times, and recalls alst one done at dentist was sbp 156 BP Readings from Last 3 Encounters:  08/11/15 146/90  02/15/15 126/74  10/18/14 128/81  Flu shot due today Past Medical History  Diagnosis Date  . ALLERGIC RHINITIS   . FASCIITIS, PLANTAR   . HYPERCHOLESTEROLEMIA   . HYPERLIPIDEMIA   . HYPERTENSION   . INTERNAL HEMORRHOIDS   . Erectile dysfunction    Past Surgical History  Procedure Laterality Date  . Mass excision from left forearm      lipoma  . Facial fracture surgery      reports that he has never smoked. He has never used smokeless tobacco. He reports that he drinks about 1.2 oz of alcohol per week. He reports that he does not use illicit drugs. family history  includes Coronary artery disease in his father and mother; Diabetes in his mother; Lung cancer in his father; Prostate cancer in his father. There is no history of Colon cancer, Esophageal cancer, Rectal cancer, or Stomach cancer. Allergies  Allergen Reactions  . Codeine Other (See Comments)    headache   Current Outpatient Prescriptions on File Prior to Visit  Medication Sig Dispense Refill  . aspirin EC 81 MG tablet Take 1 tablet (81 mg total) by mouth daily. 90 tablet 11  . NON FORMULARY Mucous relief pe    . tadalafil (CIALIS) 20 MG tablet Take 1 tablet (20 mg total) by mouth daily as needed for erectile dysfunction. 10 tablet 11  . triamcinolone (NASACORT AQ) 55 MCG/ACT AERO nasal inhaler Place 2 sprays into the nose daily. 1 Inhaler 12   No current facility-administered medications on file prior to visit.    Review of Systems Constitutional: Negative for increased diaphoresis, other activity, appetite or siginficant weight change other than noted HENT: Negative for worsening hearing loss, ear pain, facial swelling, mouth sores and neck stiffness.   Eyes: Negative for other worsening pain, redness or visual disturbance.  Respiratory: Negative for shortness of breath and wheezing  Cardiovascular: Negative for chest pain and palpitations.  Gastrointestinal: Negative for diarrhea, blood in stool, abdominal distention or other pain Genitourinary: Negative for hematuria, flank pain or change in urine volume.  Musculoskeletal: Negative for myalgias or other joint complaints.  Skin: Negative for color change and wound or drainage.  Neurological: Negative for syncope and numbness. other than noted Hematological: Negative for adenopathy. or other swelling Psychiatric/Behavioral: Negative for hallucinations, SI, self-injury, decreased concentration or other worsening agitation.      Objective:   Physical Exam BP 146/90 mmHg  Pulse 91  Temp(Src) 97.6 F (36.4 C) (Oral)  Ht  (1.956  m)  Wt 255 lb (115.667 kg)  BMI 30.23 kg/m2  SpO2 97% VS noted, mild ill appaering Constitutional: Pt is oriented to person, place, and time. Appears well-developed and well-nourished, in no significant distress Head: Normocephalic and atraumatic.  Right Ear: External ear normal.  Left Ear: External ear normal.  Nose: Nose normal.  Bilat tm's with mild erythema.  Max sinus areas non tender.  Pharynx with mild erythema, no exudate Mouth/Throat: Oropharynx is clear and moist.  Eyes: Conjunctivae and EOM are normal. Pupils are equal, round, and reactive to light.  Neck: Normal range of motion. Neck supple. No JVD present. No tracheal deviation present or significant neck LA or mass Cardiovascular: Normal rate, regular rhythm, normal heart sounds and intact distal pulses.   Pulmonary/Chest: Effort normal and breath sounds decreased without rales but few scattered wheezing  Abdominal: Soft. Bowel sounds are normal. NT. No HSM  Musculoskeletal: Normal range of motion. Exhibits no edema.  Lymphadenopathy:  Has no cervical adenopathy.  Neurological: Pt is alert and oriented to person, place, and time. Pt has normal reflexes. No cranial nerve deficit. Motor grossly intact Skin: Skin is warm and dry. No rash noted.  Psychiatric:  Has normal mood and affect. Behavior is normal.     Assessment & Plan:

## 2015-08-12 ENCOUNTER — Other Ambulatory Visit: Payer: Self-pay | Admitting: Internal Medicine

## 2015-08-12 ENCOUNTER — Encounter: Payer: Self-pay | Admitting: Internal Medicine

## 2015-08-12 LAB — HEPATITIS C ANTIBODY: HCV AB: NEGATIVE

## 2015-08-12 MED ORDER — ATORVASTATIN CALCIUM 20 MG PO TABS: 20.0000 mg | ORAL_TABLET | Freq: Every day | ORAL | Status: DC

## 2015-08-14 NOTE — Assessment & Plan Note (Signed)
stable overall by history and exam, recent data reviewed with pt, and pt to continue medical treatment as before,  to f/u any worsening symptoms or concerns Lab Results  Component Value Date   LDLCALC 159* 08/11/2015   For statin for > 100

## 2015-08-14 NOTE — Assessment & Plan Note (Signed)

## 2015-08-14 NOTE — Assessment & Plan Note (Signed)
Mld elev today likely situational, o/w stable overall by history and exam, recent data reviewed with pt, and pt to continue medical treatment as before,  to f/u any worsening symptoms or concerns BP Readings from Last 3 Encounters:  08/11/15 146/90  02/15/15 126/74  10/18/14 128/81

## 2015-08-14 NOTE — Assessment & Plan Note (Signed)
/  Mild to mod, for predpac asd,  to f/u any worsening symptoms or concerns 

## 2015-08-14 NOTE — Assessment & Plan Note (Signed)
Mild to mod, for antibx, cough med prn,  to f/u any worsening symptoms or concerns

## 2015-08-16 ENCOUNTER — Encounter: Payer: BC Managed Care – PPO | Admitting: Internal Medicine

## 2015-08-25 ENCOUNTER — Encounter: Payer: BLUE CROSS/BLUE SHIELD | Admitting: Internal Medicine

## 2016-08-14 ENCOUNTER — Encounter: Payer: BLUE CROSS/BLUE SHIELD | Admitting: Internal Medicine

## 2016-09-04 ENCOUNTER — Encounter: Payer: BLUE CROSS/BLUE SHIELD | Admitting: Internal Medicine

## 2016-12-12 ENCOUNTER — Ambulatory Visit (INDEPENDENT_AMBULATORY_CARE_PROVIDER_SITE_OTHER): Payer: BLUE CROSS/BLUE SHIELD | Admitting: Internal Medicine

## 2016-12-12 ENCOUNTER — Encounter: Payer: Self-pay | Admitting: Internal Medicine

## 2016-12-12 ENCOUNTER — Other Ambulatory Visit (INDEPENDENT_AMBULATORY_CARE_PROVIDER_SITE_OTHER): Payer: BLUE CROSS/BLUE SHIELD

## 2016-12-12 VITALS — BP 126/68 | HR 86 | Temp 98.3°F | Ht 77.5 in | Wt 253.0 lb

## 2016-12-12 DIAGNOSIS — Z0000 Encounter for general adult medical examination without abnormal findings: Secondary | ICD-10-CM

## 2016-12-12 DIAGNOSIS — I10 Essential (primary) hypertension: Secondary | ICD-10-CM | POA: Diagnosis not present

## 2016-12-12 DIAGNOSIS — M25512 Pain in left shoulder: Secondary | ICD-10-CM | POA: Diagnosis not present

## 2016-12-12 DIAGNOSIS — M67919 Unspecified disorder of synovium and tendon, unspecified shoulder: Secondary | ICD-10-CM | POA: Insufficient documentation

## 2016-12-12 DIAGNOSIS — R7989 Other specified abnormal findings of blood chemistry: Secondary | ICD-10-CM | POA: Diagnosis not present

## 2016-12-12 LAB — URINALYSIS, ROUTINE W REFLEX MICROSCOPIC
BILIRUBIN URINE: NEGATIVE
HGB URINE DIPSTICK: NEGATIVE
KETONES UR: NEGATIVE
LEUKOCYTES UA: NEGATIVE
NITRITE: NEGATIVE
RBC / HPF: NONE SEEN (ref 0–?)
Specific Gravity, Urine: >=1.03 — AB (ref 1.000–1.030)
Total Protein, Urine: NEGATIVE
URINE GLUCOSE: NEGATIVE
Urobilinogen, UA: 0.2 (ref 0.0–1.0)
pH: 5.5 (ref 5.0–8.0)

## 2016-12-12 LAB — CBC WITH DIFFERENTIAL/PLATELET
BASOS PCT: 0.7 % (ref 0.0–3.0)
Basophils Absolute: 0 10*3/uL (ref 0.0–0.1)
EOS ABS: 0.2 10*3/uL (ref 0.0–0.7)
EOS PCT: 2.9 % (ref 0.0–5.0)
HEMATOCRIT: 44.2 % (ref 39.0–52.0)
HEMOGLOBIN: 14.9 g/dL (ref 13.0–17.0)
LYMPHS PCT: 32.5 % (ref 12.0–46.0)
Lymphs Abs: 2.4 10*3/uL (ref 0.7–4.0)
MCHC: 33.6 g/dL (ref 30.0–36.0)
MCV: 83.2 fl (ref 78.0–100.0)
MONO ABS: 0.7 10*3/uL (ref 0.1–1.0)
Monocytes Relative: 9.2 % (ref 3.0–12.0)
Neutro Abs: 4 10*3/uL (ref 1.4–7.7)
Neutrophils Relative %: 54.7 % (ref 43.0–77.0)
Platelets: 208 10*3/uL (ref 150.0–400.0)
RBC: 5.31 Mil/uL (ref 4.22–5.81)
RDW: 13.6 % (ref 11.5–15.5)
WBC: 7.3 10*3/uL (ref 4.0–10.5)

## 2016-12-12 LAB — HEPATIC FUNCTION PANEL
ALBUMIN: 4.2 g/dL (ref 3.5–5.2)
ALT: 26 U/L (ref 0–53)
AST: 19 U/L (ref 0–37)
Alkaline Phosphatase: 50 U/L (ref 39–117)
BILIRUBIN TOTAL: 0.5 mg/dL (ref 0.2–1.2)
Bilirubin, Direct: 0.1 mg/dL (ref 0.0–0.3)
Total Protein: 7.2 g/dL (ref 6.0–8.3)

## 2016-12-12 LAB — BASIC METABOLIC PANEL
BUN: 16 mg/dL (ref 6–23)
CO2: 27 mEq/L (ref 19–32)
CREATININE: 1.14 mg/dL (ref 0.40–1.50)
Calcium: 9.3 mg/dL (ref 8.4–10.5)
Chloride: 104 mEq/L (ref 96–112)
GFR: 86.53 mL/min (ref >60.00)
Glucose, Bld: 91 mg/dL (ref 70–99)
POTASSIUM: 4.1 meq/L (ref 3.5–5.1)
Sodium: 137 mEq/L (ref 135–145)

## 2016-12-12 LAB — LDL CHOLESTEROL, DIRECT: LDL DIRECT: 135 mg/dL

## 2016-12-12 LAB — PSA: PSA: 1.74 ng/mL (ref 0.10–4.00)

## 2016-12-12 LAB — LIPID PANEL
CHOL/HDL RATIO: 6
CHOLESTEROL: 217 mg/dL — AB (ref 0–200)
HDL: 36.2 mg/dL — ABNORMAL LOW (ref >39.00)
NonHDL: 180.58
TRIGLYCERIDES: 223 mg/dL — AB (ref 0.0–149.0)
VLDL: 44.6 mg/dL — AB (ref 0.0–40.0)

## 2016-12-12 LAB — TSH: TSH: 0.42 u[IU]/mL (ref 0.35–4.50)

## 2016-12-12 MED ORDER — SILDENAFIL CITRATE 100 MG PO TABS: 50.0000 mg | ORAL_TABLET | Freq: Every day | ORAL | 11 refills | Status: DC | PRN

## 2016-12-12 MED ORDER — BUDESONIDE-FORMOTEROL FUMARATE 160-4.5 MCG/ACT IN AERO: 2.0000 | INHALATION_SPRAY | Freq: Two times a day (BID) | RESPIRATORY_TRACT | 3 refills | Status: DC

## 2016-12-12 MED ORDER — ATORVASTATIN CALCIUM 10 MG PO TABS: 10.0000 mg | ORAL_TABLET | Freq: Every day | ORAL | 3 refills | Status: DC

## 2016-12-12 MED ORDER — TADALAFIL 20 MG PO TABS: 20.0000 mg | ORAL_TABLET | Freq: Every day | ORAL | 11 refills | Status: DC | PRN

## 2016-12-12 MED ORDER — TRIAMCINOLONE ACETONIDE 55 MCG/ACT NA AERO: 2.0000 | INHALATION_SPRAY | Freq: Every day | NASAL | 12 refills | Status: DC

## 2016-12-12 NOTE — Assessment & Plan Note (Signed)
stable overall by history and exam, recent data reviewed with pt, and pt to continue medical treatment as before,  to f/u any worsening symptoms or concerns BP Readings from Last 3 Encounters:  12/12/16 126/68  08/11/15 (!) 146/90  02/15/15 126/74

## 2016-12-12 NOTE — Progress Notes (Addendum)
Subjective:    Patient ID: Edward [facility], male    DOB: [birth year], 51 y.o.   MRN: [id number]  HPI Here for wellness and f/u;  Overall doing ok;  Pt denies Chest pain, worsening SOB, DOE, wheezing, orthopnea, PND, worsening LE edema, palpitations, dizziness or syncope.  Pt denies neurological change such as new headache, facial or extremity weakness.  Pt denies polydipsia, polyuria, or low sugar symptoms. Pt states overall good compliance with treatment and medications, good tolerability, and has been trying to follow appropriate diet.  Pt denies worsening depressive symptoms, suicidal ideation or panic. No fever, night sweats, wt loss, loss of appetite, or other constitutional symptoms.  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   c/o mild left shoulder pain on abduction, worse recently at [employer] with lifting.  Past Medical History:  Diagnosis Date  . ALLERGIC RHINITIS   . Erectile dysfunction   . FASCIITIS, PLANTAR   . HYPERCHOLESTEROLEMIA   . HYPERLIPIDEMIA   . HYPERTENSION   . INTERNAL HEMORRHOIDS    Past Surgical History:  Procedure Laterality Date  . FACIAL FRACTURE SURGERY    . Mass excision from Left forearm     lipoma    reports that he has never smoked. He has never used smokeless tobacco. He reports that he drinks about 1.2 oz of alcohol per week . He reports that he does not use drugs. family history includes Coronary artery disease in his father and mother; Diabetes in his mother; Lung cancer in his father; Prostate cancer in his father. Allergies  Allergen Reactions  . Codeine Other (See Comments)    headache   Current Outpatient Prescriptions on File Prior to Visit  Medication Sig Dispense Refill  . aspirin EC 81 MG tablet Take 1 tablet (81 mg total) by mouth daily. 90  tablet 11  . NON FORMULARY Mucous relief pe    . atorvastatin (LIPITOR) 20 MG tablet Take 1 tablet (20 mg total) by mouth daily. 90 tablet 3   No current facility-administered medications on file prior to visit.    Current Outpatient Prescriptions on File Prior to Visit  Medication Sig Dispense Refill  . aspirin EC 81 MG tablet Take 1 tablet (81 mg total) by mouth daily. 90 tablet 11  . NON FORMULARY Mucous relief pe    . atorvastatin (LIPITOR) 20 MG tablet Take 1 tablet (20 mg total) by mouth daily. 90 tablet 3   No current facility-administered medications on file prior to visit.    Review of Systems Constitutional: Negative for other unusual diaphoresis, sweats, appetite or weight changes HENT: Negative for other worsening hearing loss, ear pain, facial swelling, mouth sores or neck stiffness.   Eyes: Negative for other worsening pain, redness or other visual disturbance.  Respiratory: Negative for other stridor or swelling Cardiovascular: Negative for other palpitations or other chest pain  Gastrointestinal: Negative for worsening diarrhea or loose stools, blood in stool, distention or other pain Genitourinary: Negative for hematuria, flank pain or other change in urine volume.  Musculoskeletal: Negative for myalgias or other joint swelling.  Skin: Negative for other color change, or other wound or worsening drainage.  Neurological: Negative for other syncope or numbness. Hematological: Negative for other adenopathy or swelling Psychiatric/Behavioral: Negative for hallucinations, other worsening agitation, SI,  self-injury, or new decreased concentration All other system neg per pt    Objective:   Physical Exam BP 126/68   Pulse 86   Temp 98.3 F (36.8 C) (Oral)   Ht 6' 5.5" (1.969 m)   Wt 253 lb (114.8 kg)   SpO2 99%   BMI 29.62 kg/m  VS noted, overweight Constitutional: Pt is oriented to person, place, and time. Appears well-developed and well-nourished, in no  significant distress and comfortable Head: Normocephalic and atraumatic  Eyes: Conjunctivae and EOM are normal. Pupils are equal, round, and reactive to light Right Ear: External ear normal without discharge Left Ear: External ear normal without discharge Nose: Nose without discharge or deformity Mouth/Throat: Oropharynx is without other ulcerations and moist  Neck: Normal range of motion. Neck supple. No JVD present. No tracheal deviation present or significant neck LA or mass Cardiovascular: Normal rate, regular rhythm, normal heart sounds and intact distal pulses.   Pulmonary/Chest: WOB normal and breath sounds without rales or wheezing  Abdominal: Soft. Bowel sounds are normal. NT. No HSM  Musculoskeletal: Normal range of motion. Exhibits no edema, left shoulder NT but with discomfort on full abduction Lymphadenopathy: Has no other cervical adenopathy.  Neurological: Pt is alert and oriented to person, place, and time. Pt has normal reflexes. No cranial nerve deficit. Motor grossly intact, Gait intact Skin: Skin is warm and dry. No rash noted or new ulcerations Psychiatric:  Has normal mood and affect. Behavior is normal without agitation No other exam findings  ECG today I have personally interpreted Sinus  Rhythm - WNL    Assessment & Plan:

## 2016-12-12 NOTE — Assessment & Plan Note (Signed)

## 2016-12-12 NOTE — Patient Instructions (Signed)
Please take all new medication as prescribed - the viagra or the cialis  Please continue all other medications as before, and refills have been done if requested.  Please have the pharmacy call with any other refills you may need.  Please continue your efforts at being more active, low cholesterol diet, and weight control.  You are otherwise up to date with prevention measures today.  Please keep your appointments with your specialists as you may have planned  You will be contacted regarding the referral for: Dr Katrinka Blazing (sports medicine)  Please go to the LAB in the Basement (turn left off the elevator) for the tests to be done today  You will be contacted by phone if any changes need to be made immediately.  Otherwise, you will receive a letter about your results with an explanation, but please check with MyChart first.  Please remember to sign up for MyChart if you have not done so, as this will be important to you in the future with finding out test results, communicating by private email, and scheduling acute appointments online when needed.  Please return in 1 year for your yearly visit, or sooner if needed, with Lab testing done 3-5 days before

## 2016-12-12 NOTE — Assessment & Plan Note (Signed)
c/w impingement syndrome, for sport med referral

## 2016-12-12 NOTE — Progress Notes (Signed)
Pre visit review using our clinic review tool, if applicable. No additional management support is needed unless otherwise documented below in the visit note. 

## 2016-12-13 ENCOUNTER — Encounter: Payer: Self-pay | Admitting: Internal Medicine

## 2016-12-13 ENCOUNTER — Other Ambulatory Visit: Payer: Self-pay | Admitting: Internal Medicine

## 2016-12-13 LAB — HIV ANTIBODY (ROUTINE TESTING W REFLEX): HIV: NONREACTIVE

## 2016-12-13 MED ORDER — ATORVASTATIN CALCIUM 20 MG PO TABS: 20.0000 mg | ORAL_TABLET | Freq: Every day | ORAL | 3 refills | Status: DC

## 2016-12-14 ENCOUNTER — Telehealth: Payer: Self-pay

## 2016-12-14 NOTE — Telephone Encounter (Signed)
Pt informed and expressed understanding. 

## 2016-12-14 NOTE — Telephone Encounter (Signed)
-----   Message from Corwin Levins, MD sent at 12/13/2016  8:18 AM EDT ----- Left message on MyChart, pt to cont same tx except  The test results show that your current treatment is OK, except the LDL cholesterol is still high, though it has improved.  We should increase the lipitor to 20 mg per day.  I will send a new prescription, and you should hear from the office as well. Lendell Caprice to please inform pt, I will do rx

## 2017-02-07 ENCOUNTER — Encounter: Payer: Self-pay | Admitting: Internal Medicine

## 2017-02-11 ENCOUNTER — Ambulatory Visit (INDEPENDENT_AMBULATORY_CARE_PROVIDER_SITE_OTHER): Payer: BLUE CROSS/BLUE SHIELD | Admitting: Sports Medicine

## 2017-02-11 ENCOUNTER — Encounter: Payer: Self-pay | Admitting: Sports Medicine

## 2017-02-11 ENCOUNTER — Ambulatory Visit: Payer: Self-pay

## 2017-02-11 ENCOUNTER — Ambulatory Visit (INDEPENDENT_AMBULATORY_CARE_PROVIDER_SITE_OTHER): Payer: BLUE CROSS/BLUE SHIELD

## 2017-02-11 VITALS — BP 120/72 | HR 83 | Ht 77.5 in | Wt 254.0 lb

## 2017-02-11 DIAGNOSIS — M25512 Pain in left shoulder: Secondary | ICD-10-CM

## 2017-02-11 DIAGNOSIS — M67912 Unspecified disorder of synovium and tendon, left shoulder: Secondary | ICD-10-CM | POA: Diagnosis not present

## 2017-02-11 MED ORDER — NITROGLYCERIN 0.2 MG/HR TD PT24: MEDICATED_PATCH | TRANSDERMAL | 1 refills | Status: DC

## 2017-02-11 NOTE — Progress Notes (Signed)
OFFICE VISIT NOTE Edward FellsMichael D. Delorise [name], DO   Sports [facility] [place] [phone]  Edward Reid L Spells - 51 y.o. male MRN [id number]  Date of birth: 1966-03-10  Visit Date: 02/11/2017  PCP: Edward Reid, Edward W, MD   Referred by: Edward Reid, Edward W, MD  Edward DakinBrandy Reid, CMA acting as scribe for Dr. [name].  SUBJECTIVE:   Chief Complaint  Patient presents with  . pain in the shoulder   HPI: As below and per problem based documentation when appropriate.  Pt presents today with complaint of pain in the left shoulder. The pain comes and goes. There is some pain in the anterior aspect of the shoulder. Pain started Jan 2018.  No known injury or trauma.  No recent xray of the shoulder  The pain is described as aching pain and is rated as 5/10 currently but gets worse with movement. At times there is a pinching sensation and at times it will throb "like a tooth ache".  Worsened with abduction, lifting. Pt works for [employer].  Pt has not tried using ice or heat on the shoulder. He says that nothing seems to completely alleviate the pain.  Therapies tried include : Pt takes a daily Aspirin but hasn't taken anything for his shoulder pain. He does take Tylenol for headaches.   Other associated symptoms include: Pain radiates into the upper arm when raising the arm.  Pt denies fever, chills, night sweats, unintentional weight loss or weight gain.     Review of Systems  Constitutional: Negative for chills and fever.  Respiratory: Negative for shortness of breath and wheezing.   Cardiovascular: Positive for leg swelling. Negative for chest pain and palpitations.  Musculoskeletal: Positive for joint pain and myalgias. Negative for falls.  Neurological: Positive for headaches. Negative for dizziness and tingling.  Endo/Heme/Allergies: Does not bruise/bleed easily.    Otherwise per HPI.  HISTORY & PERTINENT PRIOR DATA:  No specialty comments available. He reports that he has  never smoked. He has never used smokeless tobacco. No results for input(s): HGBA1C, LABURIC in the last 8760 hours. Medications & Allergies reviewed per EMR Patient Active Problem List   Diagnosis Date Noted  . Tendinopathy of rotator cuff 12/12/2016  . Cough 08/11/2015  . Wheezing 08/11/2015  . Skin lesion 08/10/2014  . Preventative health care 01/16/2012  . Erectile dysfunction   . ORTHOSTATIC DIZZINESS 04/06/2010  . Essential hypertension 05/19/2008  . INTERNAL HEMORRHOIDS 04/13/2008  . HYPERCHOLESTEROLEMIA 06/24/2007  . Allergic rhinitis 06/24/2007  . FASCIITIS, PLANTAR 06/24/2007   Past Medical History:  Diagnosis Date  . ALLERGIC RHINITIS   . Erectile dysfunction   . FASCIITIS, PLANTAR   . HYPERCHOLESTEROLEMIA   . HYPERLIPIDEMIA   . HYPERTENSION   . INTERNAL HEMORRHOIDS    Family History  Problem Relation Age of Onset  . Diabetes Mother   . Coronary artery disease Mother        hx CABG  . Coronary artery disease Father        Hx MI  . Prostate cancer Father   . Lung cancer Father   . Colon cancer Neg Hx   . Esophageal cancer Neg Hx   . Rectal cancer Neg Hx   . Stomach cancer Neg Hx    Past Surgical History:  Procedure Laterality Date  . FACIAL FRACTURE SURGERY    . Mass excision from Left forearm     lipoma   Social History   Occupational History  . Not on  file.   Social History Main Topics  . Smoking status: Never Smoker  . Smokeless tobacco: Never Used  . Alcohol use 1.2 oz/week    2 Cans of beer per week  . Drug use: No  . Sexual activity: Not on file    OBJECTIVE:  VS:  HT:6' 5.5" (196.9 cm)   WT:254 lb (115.2 kg)  BMI:29.8    BP:120/72  HR:83bpm  TEMP: ( )  RESP:93 % EXAM: Findings:  WDWN, NAD, Non-toxic appearing Alert & appropriately interactive Not depressed or anxious appearing No increased work of breathing. Pupils are equal. EOM intact without nystagmus No clubbing or cyanosis of the extremities appreciated No significant  rashes/lesions/ulcerations overlying the examined area. Radial pulses 2+/4.  No significant generalized UE edema. Sensation intact to light touch in upper extremities.  Left Shoulder Exam: Normal alignment, Normal Contours No overlying erythema/ecchymosis. No pain or crepitation with axial loading and circumduction TTP over: Anterior lateral deltoid No TTP over: Bony prominences Internal Rotation: Normal External Rotation: Normal Empty can: Strength is well-preserved at 5+/5 however moderately painful. Hawkins: No pain Neers: Only mild pain Speeds: Mild pain O'Brien's: Mild pain  ++++++++++++++++++++++++++++++++++++++++++++++++++++++++++++++++++ LIMITED MSK ULTRASOUND OF left shoulder Images were obtained and interpreted by myself, Edward Bidding, DO  Images have been saved and stored to PACS system. Images obtained on: GE S7 Ultrasound machine  FINDINGS:  Biceps Tendon: Normal Pec Major Insertion: Normal Subscapularis Tendon: Slight thickening of the superior fibers with intact, no impingement Supraspinatus Tendon: Hypoechoic change within the anterior fibers consistent with a partial thickness tear without retraction Infraspinatus/Teres Minor Tendon: Normal AC Joint: Slight mushroom sign, no pain with some palpation JOINT: No significant GH spurring appreciated LABRUM: Not evaluated  IMPRESSION:  1.  Small supraspinatus interstitial tear without significant retraction, glenoid sided 2.  AC joint arthropathy, asymptomatic     Dg Shoulder Left  Result Date: 02/11/2017 CLINICAL DATA:  51 year old male with left shoulder pain for the past 8 months. No injury. Initial encounter. EXAM: LEFT SHOULDER - 2+ VIEW COMPARISON:  None. FINDINGS: Mild acromioclavicular joint degenerative changes. No fracture or dislocation. No abnormal soft tissue calcifications. Visualized lungs clear. IMPRESSION: Mild acromioclavicular joint degenerative changes. Electronically Signed   By: Edward Reid M.D.   On: 02/11/2017 18:23   ASSESSMENT & PLAN:   Problem List Items Addressed This Visit    Tendinopathy of rotator cuff - Primary    Mildly symptomatic today but worse with active motion.  No evidence of impingement. Therapeutic exercises reviewed in detail with focus on rotator cuff isometric and eccentric phase strengthening and scapular stabilization. Range of motion protocol reviewed in detail. Repeat ultrasound at 6-week follow-up      Relevant Medications   nitroGLYCERIN (NITRODUR - DOSED IN MG/24 HR) 0.2 mg/hr patch      Follow-up: Return in about 6 weeks (around 03/25/2017).   CMA/ATC served as Neurosurgeon during this visit. History, Physical, and Plan performed by medical provider. Documentation and orders reviewed and attested to.      Edward Bidding, DO    [facility] Phys[facility]

## 2017-02-11 NOTE — Patient Instructions (Addendum)
Please perform the exercise program that Fayrene FearingJames has prepared for you and gone over in detail on a daily basis.  In addition to the handout you were provided you can access your program through: [email or web address]   Your unique program code is: [email or web address]  Nitroglycerin Protocol   Apply 1/4 nitroglycerin patch to affected area daily.  Change position of patch within the affected area every 24 hours.  You may experience a headache during the first 1-2 weeks of using the patch, these should subside.  If you experience headaches after beginning nitroglycerin patch treatment, you may take your preferred over the counter pain reliever.  Another side effect of the nitroglycerin patch is skin irritation or rash related to patch adhesive.  Please notify our office if you develop more severe headaches or rash, and stop the patch.  Tendon healing with nitroglycerin patch may require 12 to 24 weeks depending on the extent of injury.  Men should not use if taking Viagra, Cialis, or Levitra.   Do not use if you have migraines or rosacea.

## 2017-02-12 ENCOUNTER — Telehealth: Payer: Self-pay | Admitting: Sports Medicine

## 2017-02-12 NOTE — Telephone Encounter (Signed)
Letter at the front for pick up.

## 2017-02-12 NOTE — Telephone Encounter (Signed)
Forwarding to Dr. Rigby.  

## 2017-02-12 NOTE — Telephone Encounter (Signed)
Patient has limitations that I outlined during the office visit.  If [employer] does not have any jobs that can accommodate this this is up to their HR department to deal with.  I have no indication to keep him out of work entirely as he is perfectly able to use his legs and contralateral arm.  If [employer] does not have work available for this then as outlined in the note he should be completely out of work per their recommendations

## 2017-02-12 NOTE — Telephone Encounter (Signed)
Patient called in to advise that he gave [employer] his light duty note. They are telling him that he either needs to be 100% or completely out,. He is asking if he needs time to recuperate, then we need to write him out of work altogether until he improves.

## 2017-02-13 NOTE — Telephone Encounter (Signed)
Called and left VM for pt advising that letter is at the front for pick up.

## 2017-02-19 NOTE — Assessment & Plan Note (Signed)
Mildly symptomatic today but worse with active motion.  No evidence of impingement. Therapeutic exercises reviewed in detail with focus on rotator cuff isometric and eccentric phase strengthening and scapular stabilization. Range of motion protocol reviewed in detail. Repeat ultrasound at 6-week follow-up

## 2017-02-26 NOTE — Telephone Encounter (Signed)
Pt dropped off FMLA ppw to be filled out. He states there is not any light duty at work and needs to be written out of work until he is released by Dr. [name].

## 2017-02-27 NOTE — Telephone Encounter (Signed)
I have this patients paperwork on my desk. Called patient with no answer and left a message.

## 2017-03-01 NOTE — Telephone Encounter (Signed)
Do you want pt to come in sooner than his scheduled f/u to discuss his limitations and request to have FMLA paperwork completed?

## 2017-03-04 NOTE — Telephone Encounter (Signed)
Needs to come in if he has more questions about FMLA.  At this time the restrictions for work include no use of his upper extremity.  As previously discussed if his job does not have this available this will need to be discussed with his HR person.  From a medical perspective there is no contra-indication to him being present at work.

## 2017-03-04 NOTE — Telephone Encounter (Signed)
Edward Reid spoke with pt this morning and he has been scheduled to come in 03/05/17.

## 2017-03-05 ENCOUNTER — Encounter: Payer: Self-pay | Admitting: Sports Medicine

## 2017-03-05 ENCOUNTER — Ambulatory Visit (INDEPENDENT_AMBULATORY_CARE_PROVIDER_SITE_OTHER): Payer: BLUE CROSS/BLUE SHIELD | Admitting: Sports Medicine

## 2017-03-05 VITALS — BP 140/80 | HR 76 | Ht 77.5 in | Wt 251.2 lb

## 2017-03-05 DIAGNOSIS — M67912 Unspecified disorder of synovium and tendon, left shoulder: Secondary | ICD-10-CM

## 2017-03-05 DIAGNOSIS — M25512 Pain in left shoulder: Secondary | ICD-10-CM | POA: Diagnosis not present

## 2017-03-05 NOTE — Progress Notes (Signed)
OFFICE VISIT NOTE Edward FellsMichael D. Reid [name], DO  [place] [facility] Edward Reid [phone]  Edward Sersvan L Reid - 51 y.o. male MRN [id number]  Date of birth: Dec 10, 1965  Visit Date: 03/05/2017  PCP: Edward LevinsJohn, Reid W, MD   Referred by: Edward LevinsJohn, Reid W, MD  Edward CaoAutumn Reid, cma acting as scribe for Dr. Berline Reid.  SUBJECTIVE:   Chief Complaint  Patient presents with  . Follow-up  . Review  FMLA Paperwork   HPI: As below and per problem based documentation when appropriate.   Edward Reid presents today to discuss FMLA paperwork. Pt was seen on 02-11-2017, given a work note that put him on light duty at work for his left shoulder pain. His Employer is [employer] and they do not have any light duty work options. He would like to discuss having FMLA completed today. He is not allowed to go back to work until he is 100 %. Today he reported limited ROM in his left arm. He is using Nitro patches daily with some relief. Has tried placing the nitro patches in different areas of the shoulder/arm. Has been consistent with therapeutic exercises. There is a sharp pain in his upper arm when trying to lift over his head.     Review of Systems  Constitutional: Negative for chills, diaphoresis, fever, malaise/fatigue and weight loss.  HENT: Negative.   Respiratory: Negative.   Gastrointestinal: Negative.   Genitourinary: Negative.   Musculoskeletal: Positive for joint pain and myalgias. Negative for back pain, falls and neck pain.  Skin: Negative.   Neurological: Negative.  Negative for weakness.  Endo/Heme/Allergies: Negative for environmental allergies and polydipsia. Does not bruise/bleed easily.  Psychiatric/Behavioral: Negative.     Otherwise per HPI.  HISTORY & PERTINENT PRIOR DATA:  No specialty comments available. He reports that he has never smoked. He has never used smokeless tobacco. No results for input(s): HGBA1C, LABURIC in the last 8760 hours. Medications & Allergies reviewed per  EMR Patient Active Problem List   Diagnosis Date Noted  . Tendinopathy of rotator cuff 12/12/2016  . Cough 08/11/2015  . Wheezing 08/11/2015  . Skin lesion 08/10/2014  . Preventative health Reid 01/16/2012  . Erectile dysfunction   . ORTHOSTATIC DIZZINESS 04/06/2010  . Essential hypertension 05/19/2008  . INTERNAL HEMORRHOIDS 04/13/2008  . HYPERCHOLESTEROLEMIA 06/24/2007  . Allergic rhinitis 06/24/2007  . FASCIITIS, PLANTAR 06/24/2007   Past Medical History:  Diagnosis Date  . ALLERGIC RHINITIS   . Erectile dysfunction   . FASCIITIS, PLANTAR   . HYPERCHOLESTEROLEMIA   . HYPERLIPIDEMIA   . HYPERTENSION   . INTERNAL HEMORRHOIDS    Family History  Problem Relation Age of Onset  . Diabetes Mother   . Coronary artery disease Mother        hx CABG  . Coronary artery disease Father        Hx MI  . Prostate cancer Father   . Lung cancer Father   . Colon cancer Neg Hx   . Esophageal cancer Neg Hx   . Rectal cancer Neg Hx   . Stomach cancer Neg Hx    Past Surgical History:  Procedure Laterality Date  . FACIAL FRACTURE SURGERY    . Mass excision from Left forearm     lipoma   Social History   Occupational History  . Not on file.   Social History Main Topics  . Smoking status: Never Smoker  . Smokeless tobacco: Never Used  . Alcohol use 1.2 oz/week  2 Cans of beer per week  . Drug use: No  . Sexual activity: Not on file    OBJECTIVE:  VS:  HT:6' 5.5" (196.9 cm)   WT:251 lb 3.2 oz (113.9 kg)  BMI:29.5    BP:140/80  HR:76bpm  TEMP: ( )  RESP:98 % EXAM: Findings:  Adult male.  No acute distress.  Alert and appropriate.  Left shoulder has overall improved range of motion is from last visit and still has pain with empty can testing as well as external rotation.  He has pain with cross body testing as well as with speeds and O'Brien's testing.  Upper extremity strength is intact.  No pain with brachial plexus squeeze.  Good cervical range of motion.     No  results found. ASSESSMENT & PLAN:     ICD-10-CM   1. Left shoulder pain, unspecified chronicity M25.512   2. Tendinopathy of left rotator cuff M67.912   ================================================================= Tendinopathy of rotator cuff >50% of this 25 minute visit spent in direct patient counseling and/or coordination of Reid.  Discussion was focused on education regarding the in discussing the pathoetiology and anticipated clinical course of the above condition.  Ultimately we discussed that he does have changes within the rotator cuff that explains symptoms that he is experiencing in is likely exacerbating while at work using his left arm.  His right upper extremity bilateral lower extremities neck and back are all appropriately healthy and if any light duty work is available this is most appropriate but it sounds as though this is not the case.  Ultimately this will be an HR decision regarding whether or not he has work available to perform without using his left arm.  If not that he should be out of work.  We will plan to continue with therapeutic exercises and nitroglycerin protocol previously.  Follow-up with him in 4 weeks. ================================================================= Follow-up: Return in about 4 weeks (around 04/02/2017).   CMA/ATC served as Neurosurgeon during this visit. History, Physical, and Plan performed by medical provider. Documentation and orders reviewed and attested to.      Gaspar Bidding, DO    [facility] Phys[facility]

## 2017-03-12 NOTE — Telephone Encounter (Signed)
Patient called to give an updated fax # for FMLA. He is going to bring us the originals so that we can fax. The fax # is [phone] [facility] [employer]

## 2017-03-25 ENCOUNTER — Ambulatory Visit: Payer: BLUE CROSS/BLUE SHIELD | Admitting: Sports Medicine

## 2017-03-25 NOTE — Assessment & Plan Note (Signed)
>  50% of this 25 minute visit spent in direct patient counseling and/or coordination of care.  Discussion was focused on education regarding the in discussing the pathoetiology and anticipated clinical course of the above condition.  Ultimately we discussed that he does have changes within the rotator cuff that explains symptoms that he is experiencing in is likely exacerbating while at work using his left arm.  His right upper extremity bilateral lower extremities neck and back are all appropriately healthy and if any light duty work is available this is most appropriate but it sounds as though this is not the case.  Ultimately this will be an HR decision regarding whether or not he has work available to perform without using his left arm.  If not that he should be out of work.  We will plan to continue with therapeutic exercises and nitroglycerin protocol previously.  Follow-up with him in 4 weeks.

## 2017-03-29 ENCOUNTER — Encounter: Payer: Self-pay | Admitting: Sports Medicine

## 2017-03-29 ENCOUNTER — Ambulatory Visit (INDEPENDENT_AMBULATORY_CARE_PROVIDER_SITE_OTHER): Payer: BLUE CROSS/BLUE SHIELD | Admitting: Sports Medicine

## 2017-03-29 ENCOUNTER — Telehealth: Payer: Self-pay | Admitting: Internal Medicine

## 2017-03-29 VITALS — BP 130/80 | HR 77 | Ht 77.5 in | Wt 254.2 lb

## 2017-03-29 DIAGNOSIS — M67912 Unspecified disorder of synovium and tendon, left shoulder: Secondary | ICD-10-CM

## 2017-03-29 DIAGNOSIS — M25512 Pain in left shoulder: Secondary | ICD-10-CM

## 2017-03-29 NOTE — Telephone Encounter (Signed)
FAX: [phone]  Claim Forms need to be faxed to that number. He will pick up his forms on Monday Morning/Afternoon  Thanks,  -LL

## 2017-03-29 NOTE — Progress Notes (Signed)
OFFICE VISIT NOTE Edward FellsMichael D. Delorise [name], DO  [facility] [place] [phone]  Edward Reid - 51 y.o. male MRN [id number]  Date of birth: 12-12-1965  Visit Date: 03/29/2017  PCP: [name], [name], MD   Referred by: [name], [name], MD  Clovis CaoAutumn McNeil, cma acting as scribe for Dr. [name].  SUBJECTIVE:   Chief Complaint  Patient presents with  . LT Shoulder Pain   HPI: As below and per problem based documentation when appropriate.   [name] reports some improvement since last visit. With certain movements there is a consistent sharp pain. He is wearing Nitro patches daily. Did not wear yesterday due to an onset of a headache. He is exercising daily and added in push ups. He is currently on FMLA.     Review of Systems  Constitutional: Negative for chills, diaphoresis, fever, malaise/fatigue and weight loss.  HENT: Negative.   Eyes: Negative.   Respiratory: Negative.   Cardiovascular: Negative.   Gastrointestinal: Negative.   Genitourinary: Negative.   Musculoskeletal: Positive for joint pain and myalgias. Negative for back pain, falls and neck pain.  Skin: Negative for itching and rash.  Neurological: Positive for headaches. Negative for dizziness, tingling, tremors, sensory change, speech change, focal weakness, seizures, loss of consciousness and weakness.  Psychiatric/Behavioral: Negative.     Otherwise per HPI.  HISTORY & PERTINENT PRIOR DATA:  No specialty comments available. He reports that he has never smoked. He has never used smokeless tobacco. No results for input(s): HGBA1C, LABURIC in the last 8760 hours. Medications & Allergies reviewed per EMR Patient Active Problem List   Diagnosis Date Noted  . Tendinopathy of rotator cuff 12/12/2016  . Cough 08/11/2015  . Wheezing 08/11/2015  . Skin lesion 08/10/2014  . Preventative health care 01/16/2012  . Erectile dysfunction   . ORTHOSTATIC DIZZINESS 04/06/2010  . Essential hypertension  05/19/2008  . INTERNAL HEMORRHOIDS 04/13/2008  . HYPERCHOLESTEROLEMIA 06/24/2007  . Allergic rhinitis 06/24/2007  . FASCIITIS, PLANTAR 06/24/2007   Past Medical History:  Diagnosis Date  . ALLERGIC RHINITIS   . Erectile dysfunction   . FASCIITIS, PLANTAR   . HYPERCHOLESTEROLEMIA   . HYPERLIPIDEMIA   . HYPERTENSION   . INTERNAL HEMORRHOIDS    Family History  Problem Relation Age of Onset  . Diabetes Mother   . Coronary artery disease Mother        hx CABG  . Coronary artery disease Father        Hx MI  . Prostate cancer Father   . Lung cancer Father   . Colon cancer Neg Hx   . Esophageal cancer Neg Hx   . Rectal cancer Neg Hx   . Stomach cancer Neg Hx    Past Surgical History:  Procedure Laterality Date  . FACIAL FRACTURE SURGERY    . Mass excision from Left forearm     lipoma   Social History   Occupational History  . Not on file.   Social History Main Topics  . Smoking status: Never Smoker  . Smokeless tobacco: Never Used  . Alcohol use 1.2 oz/week    2 Cans of beer per week  . Drug use: No  . Sexual activity: Not on file    OBJECTIVE:  VS:  HT:6' 5.5" (196.9 cm)   WT:254 lb 3.2 oz (115.3 kg)  BMI:29.8    BP:130/80  HR:77bpm  TEMP: ( )  RESP:98 % EXAM: Findings:  WDWN, NAD, Non-toxic appearing Alert &  appropriately interactive Not depressed or anxious appearing No increased work of breathing. Pupils are equal. EOM intact without nystagmus No clubbing or cyanosis of the extremities appreciated No significant rashes/lesions/ulcerations overlying the examined area. Radial pulses 2+/4.  No significant generalized UE edema. Sensation intact to light touch in upper extremities.  Left Shoulder Exam: Normal alignment, Normal Contours No overlying erythema/ecchymosis. No pain or crepitation with axial loading and circumduction TTP over: N/a. No focal tenderness No TTP over: Anterior shoulder, AC joint or posterior shoulder. Internal Rotation:  Normal External Rotation: Normal Empty can: Overall good strength however still a small amount of pain. Hawkins: Normal Neers: Normal Speeds:Normal O'Brien's: Normal      No results found. ASSESSMENT & PLAN:     ICD-10-CM   1. Left shoulder pain, unspecified chronicity M25.512   2. Tendinopathy of left rotator cuff M67.912   ================================================================= Tendinopathy of rotator cuff Symptoms are consistent with chronic tendinopathy.  There is no evidence of significant tearing at this time I am going to allow him to go back to work unrestricted.  We did discuss being cautious with significant overhead lifting and lowering.  If any persistent or worsening symptoms he will plan follow-up we will have him go back on August 5 give him 1 additional week of strengthening and wrist.  Should continue to cut the patch in half. ================================================================= There are no Patient Instructions on file for this visit.=================================================================  Follow-up: Return in about 6 weeks (around 05/10/2017).   CMA/ATC served as Neurosurgeonscribe during this visit. History, Physical, and Plan performed by medical provider. Documentation and orders reviewed and attested to.      Gaspar BiddingMichael Kayia Billinger, DO    [facility]

## 2017-03-29 NOTE — Assessment & Plan Note (Signed)
Symptoms are consistent with chronic tendinopathy.  There is no evidence of significant tearing at this time I am going to allow him to go back to work unrestricted.  We did discuss being cautious with significant overhead lifting and lowering.  If any persistent or worsening symptoms he will plan follow-up we will have him go back on August 5 give him 1 additional week of strengthening and wrist.  Should continue to cut the patch in half.

## 2017-04-01 NOTE — Telephone Encounter (Signed)
Will wait for Brandy to get back Wednesday to see if forms have been faxed, completed and refaxed.

## 2017-04-01 NOTE — Telephone Encounter (Signed)
Please review when you return

## 2017-04-02 NOTE — Telephone Encounter (Signed)
Patient called in to check on form for short term disability. Patient said he does not believe he can return to work on 04/08/17. Please call patient and advise. OK to leave message if no answer.

## 2017-04-03 NOTE — Telephone Encounter (Signed)
Pt is aware via voicemail that forms has been faxed.

## 2017-04-29 ENCOUNTER — Telehealth: Payer: Self-pay | Admitting: Sports Medicine

## 2017-04-29 NOTE — Telephone Encounter (Signed)
Called pt and left VM to call the office and schedule visit.

## 2017-04-29 NOTE — Telephone Encounter (Signed)
Pt scheduled for 04/30/2017.

## 2017-04-29 NOTE — Telephone Encounter (Signed)
Patient states that he came by the office on 04/05/17 stating that he was not comfortable going back to work and was told that he would hear back from our office. He has not returned to work and his short term disability ended on 04/04/17. I explained to patient that if he had not heard anything that he should have followed back up as well. He wants to know if he should be seen. Please advise.

## 2017-04-30 ENCOUNTER — Ambulatory Visit (INDEPENDENT_AMBULATORY_CARE_PROVIDER_SITE_OTHER): Payer: BLUE CROSS/BLUE SHIELD | Admitting: Sports Medicine

## 2017-04-30 ENCOUNTER — Telehealth: Payer: Self-pay

## 2017-04-30 ENCOUNTER — Encounter: Payer: Self-pay | Admitting: Sports Medicine

## 2017-04-30 VITALS — BP 124/82 | HR 80 | Ht 77.5 in | Wt 260.4 lb

## 2017-04-30 DIAGNOSIS — M67912 Unspecified disorder of synovium and tendon, left shoulder: Secondary | ICD-10-CM

## 2017-04-30 DIAGNOSIS — M25512 Pain in left shoulder: Secondary | ICD-10-CM

## 2017-04-30 NOTE — Assessment & Plan Note (Signed)
Patient has actually had slight worsening in his symptoms since last office visit.  He has been trying to increase his lifting activities to be able to safely return to work and had exacerbation after working out the gym.  He is having some weakness with external rotation as well as with empty can testing that is slightly worse than previous.  He is not tolerating the nitroglycerin patch very well.  We will plan to refer him for MR arthrogram of the left shoulder to evaluate for potential worsening of the underlying rotator cuff tendinopathy and partial tearing.

## 2017-04-30 NOTE — Telephone Encounter (Signed)
Called pt and left VM. Need to know if he is taking Aspirin daily and if he is OK with going to [facility] to have MRI done.

## 2017-04-30 NOTE — Patient Instructions (Signed)
We are ordering an MRI for you today.  The imaging office will be calling you to schedule your appointment after we obtain authorization from your insurance company.   Please be sure you have signed up for MyChart so that we can get your results to you.  We will be in touch with you as soon as we can.  Please know, it can take up to 3-4 business days for the radiologist and Dr. Raquell Richer to have time to review the results and determine the best appropriate action.  If there is something that appears to be surgical or needs a referral to other specialists we will let you know through MyChart or telephone.  Otherwise we will plan to schedule a follow up appointment with Dr. Jebediah Macrae once we have the results.   

## 2017-04-30 NOTE — Telephone Encounter (Signed)
Spoke with [facility], the soonest pt can be seen there is 9/18, they will need to call back regarding appointment time.

## 2017-04-30 NOTE — Progress Notes (Signed)
OFFICE VISIT NOTE [name]. [name] [phone]  Edward Reid - 51 y.o. male MRN [id number]  Date of birth: 06-Jan-1966  Visit Date: 04/30/2017  PCP: Corwin Levins, MD   Referred by: Corwin Levins, MD  Orlie Dakin, CMA acting as scribe for Dr. Berline Chough.  SUBJECTIVE:   Chief Complaint  Patient presents with  . Follow-up    tendinopathy lt rotator cuff   HPI: As below and per problem based documentation when appropriate.  Mr. Edward Reid is an established patient presenting today in follow-up of tendinopathy of the left rotator cuff. He was last seen 03/29/2017 and advised to follow Nitro Protocol. He had xray 02/11/17.   Pt reports continued pain in the left shoulder. He says once he thinks its healing up he will have a set back. He has continued to use the resistance bands for home therapeutic exercises. He reports that he will having swelling in the left shoulder and arm after doing exercises. He still has pain that "fires" from the elbow to the shoulder with certain movements. Pain is worse when raising the arm overhead. He continues to have decreased ROM. He has continued to use Nitro patches unless he starts to get a HA. He occasionally has tingling and numbness in the left arm.     Review of Systems  Constitutional: Negative for chills and fever.  Respiratory: Negative for shortness of breath and wheezing.   Cardiovascular: Negative for chest pain and palpitations.  Musculoskeletal: Positive for joint pain. Negative for falls.  Neurological: Positive for tingling and headaches. Negative for dizziness.  Endo/Heme/Allergies: Does not bruise/bleed easily.    Otherwise per HPI.  HISTORY & PERTINENT PRIOR DATA:  No specialty comments available. He reports that he has never smoked. He has never used smokeless tobacco. No results for input(s): HGBA1C, LABURIC in the last 8760 hours. Medications & Allergies reviewed per  EMR Patient Active Problem List   Diagnosis Date Noted  . Tendinopathy of rotator cuff 12/12/2016  . Cough 08/11/2015  . Wheezing 08/11/2015  . Skin lesion 08/10/2014  . Preventative health care 01/16/2012  . Erectile dysfunction   . ORTHOSTATIC DIZZINESS 04/06/2010  . Essential hypertension 05/19/2008  . INTERNAL HEMORRHOIDS 04/13/2008  . HYPERCHOLESTEROLEMIA 06/24/2007  . Allergic rhinitis 06/24/2007  . FASCIITIS, PLANTAR 06/24/2007   Past Medical History:  Diagnosis Date  . ALLERGIC RHINITIS   . Erectile dysfunction   . FASCIITIS, PLANTAR   . HYPERCHOLESTEROLEMIA   . HYPERLIPIDEMIA   . HYPERTENSION   . INTERNAL HEMORRHOIDS    Family History  Problem Relation Age of Onset  . Diabetes Mother   . Coronary artery disease Mother        hx CABG  . Coronary artery disease Father        Hx MI  . Prostate cancer Father   . Lung cancer Father   . Colon cancer Neg Hx   . Esophageal cancer Neg Hx   . Rectal cancer Neg Hx   . Stomach cancer Neg Hx    Past Surgical History:  Procedure Laterality Date  . FACIAL FRACTURE SURGERY    . Mass excision from Left forearm     lipoma   Social History   Occupational History  . Not on file.   Social History Main Topics  . Smoking status: Never Smoker  . Smokeless tobacco: Never Used  . Alcohol use 1.2 oz/week    2  Cans of beer per week  . Drug use: No  . Sexual activity: Not on file    OBJECTIVE:  VS:  HT:6' 5.5" (196.9 cm)   WT:260 lb 6.4 oz (118.1 kg)  BMI:30.47    BP:124/82  HR:80bpm  TEMP: ( )  RESP:97 % EXAM: Findings:  Left shoulder overall well aligned, muscular.  He has pain with resisted empty can testing.  He has no pain with axial load and circumduction.  External rotation is mildly painful but strength is 5+/5.  Internal rotation strength is 5+/5 and pain-free.  He has full overhead range of motion at this time.  Sensation is intact light touch.  No pain with brachial plexus squeeze or arm squeeze test.      No results found. ASSESSMENT & PLAN:     ICD-10-CM   1. Left shoulder pain, unspecified chronicity M25.512 DG Arthro Shoulder Left    MR SHOULDER LEFT W CONTRAST  2. Tendinopathy of left rotator cuff M67.912 DG Arthro Shoulder Left    MR SHOULDER LEFT W CONTRAST  ================================================================= Tendinopathy of rotator cuff Patient has actually had slight worsening in his symptoms since last office visit.  He has been trying to increase his lifting activities to be able to safely return to work and had exacerbation after working out the gym.  He is having some weakness with external rotation as well as with empty can testing that is slightly worse than previous.  He is not tolerating the nitroglycerin patch very well.  We will plan to refer him for MR arthrogram of the left shoulder to evaluate for potential worsening of the underlying rotator cuff tendinopathy and partial tearing. =================================================================   Follow-up: Return for MRI review.   CMA/ATC served as Neurosurgeon during this visit. History, Physical, and Plan performed by medical provider. Documentation and orders reviewed and attested to.      Gaspar Bidding, DO    [facility]

## 2017-05-01 ENCOUNTER — Other Ambulatory Visit: Payer: Self-pay

## 2017-05-01 DIAGNOSIS — M67912 Unspecified disorder of synovium and tendon, left shoulder: Secondary | ICD-10-CM

## 2017-05-01 NOTE — Telephone Encounter (Signed)
Called pt and left VM with appointment date/time/location. 9/4 @ 1:15 @ [facility]. I requested a call back to confirm that he got my message. Will contact [facility] to cancel once I hear back from pt

## 2017-05-01 NOTE — Telephone Encounter (Signed)
Patient called in stating he was told to let Dr. [name] know when his MRI was scheduled. Patient's MRI appointment is scheduled for 05/16/17 at 2:45 pm.

## 2017-05-01 NOTE — Telephone Encounter (Signed)
Patient returning call about appt.  Please call back.  Ty,  -LL

## 2017-05-01 NOTE — Telephone Encounter (Signed)
[facility], they may be able to get pt in on 9/4. Will await return call.

## 2017-05-01 NOTE — Telephone Encounter (Signed)
Helen from Imaging [facility] called to advise that their office can get the patient scheduled on 9/4 if you are still interested. Call [place] to confirm at the number provided.

## 2017-05-01 NOTE — Telephone Encounter (Signed)
Spoke with patient, will see if [facility].

## 2017-05-02 NOTE — Telephone Encounter (Signed)
Spoke with patient, he is coming by the office and will pick up appointment information.

## 2017-05-03 ENCOUNTER — Telehealth: Payer: Self-pay | Admitting: Sports Medicine

## 2017-05-03 NOTE — Telephone Encounter (Signed)
Patient needs for Dr. [name] to know that an actual date needs to be on the extension of disability if this has to be done again so that he can get paid. "Dr. [name] saying [employer] does guarantee that his job will be able to pay him."

## 2017-05-03 NOTE — Telephone Encounter (Signed)
Spoke with Dr. [name] and made aware.

## 2017-05-06 ENCOUNTER — Other Ambulatory Visit: Payer: Self-pay | Admitting: Sports Medicine

## 2017-05-06 DIAGNOSIS — M25512 Pain in left shoulder: Secondary | ICD-10-CM

## 2017-05-07 ENCOUNTER — Encounter: Payer: Self-pay | Admitting: Sports Medicine

## 2017-05-07 ENCOUNTER — Ambulatory Visit (INDEPENDENT_AMBULATORY_CARE_PROVIDER_SITE_OTHER): Payer: BLUE CROSS/BLUE SHIELD

## 2017-05-07 ENCOUNTER — Ambulatory Visit (INDEPENDENT_AMBULATORY_CARE_PROVIDER_SITE_OTHER): Payer: BLUE CROSS/BLUE SHIELD | Admitting: Sports Medicine

## 2017-05-07 VITALS — BP 136/76 | HR 81 | Wt 256.0 lb

## 2017-05-07 DIAGNOSIS — X500XXA Overexertion from strenuous movement or load, initial encounter: Secondary | ICD-10-CM

## 2017-05-07 DIAGNOSIS — M7582 Other shoulder lesions, left shoulder: Secondary | ICD-10-CM | POA: Diagnosis not present

## 2017-05-07 DIAGNOSIS — Z23 Encounter for immunization: Secondary | ICD-10-CM | POA: Diagnosis not present

## 2017-05-07 DIAGNOSIS — S46012A Strain of muscle(s) and tendon(s) of the rotator cuff of left shoulder, initial encounter: Secondary | ICD-10-CM | POA: Diagnosis not present

## 2017-05-07 DIAGNOSIS — M67912 Unspecified disorder of synovium and tendon, left shoulder: Secondary | ICD-10-CM | POA: Diagnosis not present

## 2017-05-07 NOTE — Progress Notes (Signed)
   Procedure: Real-time Ultrasound Guided gadolinium contrast injection of left glenohumeral joint Device: GE Logiq E  Verbal informed consent obtained.  Time-out conducted.  Noted no overlying erythema, induration, or other signs of local infection.  Skin prepped in a sterile fashion.  Local anesthesia: Topical Ethyl chloride.  With sterile technique and under real time ultrasound guidance:  Using a 22-gauge spinal needle taking care to avoid the labrum I advanced into the glenohumeral joint from a posterior approach, I injected 1 mL kenalog 40, 2 mL lidocaine, 2 mL bupivacaine, syringe switched and I then injected 0.1 mL gadolinium, syringe again switched and 10 mL sterile saline used to flush the needle. Joint visualized and capsule seen distending confirming intra-articular placement of contrast material and medication. Completed without difficulty  Advised to call if fevers/chills, erythema, induration, drainage, or persistent bleeding.  Images permanently stored and available for review in the ultrasound unit.  Impression: Technically successful ultrasound guided gadolinium contrast injection for MR arthrography.  Please see separate MR arthrogram report.

## 2017-05-07 NOTE — Assessment & Plan Note (Signed)
Left glenohumeral gadolinium injection in anticipation of MR arthrogram. Please see separate MRI report.

## 2017-05-10 ENCOUNTER — Ambulatory Visit (INDEPENDENT_AMBULATORY_CARE_PROVIDER_SITE_OTHER): Payer: BLUE CROSS/BLUE SHIELD | Admitting: Sports Medicine

## 2017-05-10 ENCOUNTER — Encounter: Payer: Self-pay | Admitting: Sports Medicine

## 2017-05-10 VITALS — BP 140/88 | HR 84 | Ht 77.5 in | Wt 259.8 lb

## 2017-05-10 DIAGNOSIS — M67912 Unspecified disorder of synovium and tendon, left shoulder: Secondary | ICD-10-CM | POA: Diagnosis not present

## 2017-05-10 DIAGNOSIS — M25512 Pain in left shoulder: Secondary | ICD-10-CM

## 2017-05-10 NOTE — Assessment & Plan Note (Signed)
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  Continue with nitroglycerin protocol, formal physical therapy and home exercise program.  Allowed to return to work at this time without significant restrictions and risks of this discussed in detail.  Other alternative interventions including PRP and Tenex were briefly discussed as well but at this time will continue with conservative management given continued improvement.  Work release paperwork completed today.

## 2017-05-10 NOTE — Progress Notes (Signed)
OFFICE VISIT NOTE [name]. [name] [phone]  Edward Reid - 51 y.o. male MRN [id number]  Date of birth: 01/21/1966  Visit Date: 05/10/2017  PCP: Corwin Levins, MD   Referred by: Corwin Levins, MD  Orlie Dakin, CMA acting as scribe for Dr. Berline Chough.  SUBJECTIVE:   Chief Complaint  Patient presents with  . Follow-up    LT shoulder pain   HPI: As below and per problem based documentation when appropriate.  Edward Reid is an established patient presenting today in follow-up of LT shoulder pain. He has MR arthrogram of the left shoulder 05/07/2017 and would like to discuss these results along with treatment recommendations.   He reports minimal change in shoulder pain. He feels that his shoulder is still catching when trying to hold parallel to the floor. He has occasional popping and cracking at the elbow.     Review of Systems  Constitutional: Negative for chills and fever.  Respiratory: Negative for shortness of breath and wheezing.   Cardiovascular: Negative for chest pain and palpitations.  Gastrointestinal: Negative.   Genitourinary: Negative.   Musculoskeletal: Positive for joint pain. Negative for falls.  Neurological: Negative for dizziness, tingling and headaches.  Endo/Heme/Allergies: Does not bruise/bleed easily.    Otherwise per HPI.  HISTORY & PERTINENT PRIOR DATA:  No specialty comments available. He reports that he has never smoked. He has never used smokeless tobacco. No results for input(s): HGBA1C, LABURIC in the last 8760 hours. Medications & Allergies reviewed per EMR Patient Active Problem List   Diagnosis Date Noted  . Tendinopathy of rotator cuff 12/12/2016  . Cough 08/11/2015  . Wheezing 08/11/2015  . Skin lesion 08/10/2014  . Preventative health care 01/16/2012  . Erectile dysfunction   . ORTHOSTATIC DIZZINESS 04/06/2010  . Essential hypertension 05/19/2008  . INTERNAL  HEMORRHOIDS 04/13/2008  . HYPERCHOLESTEROLEMIA 06/24/2007  . Allergic rhinitis 06/24/2007  . FASCIITIS, PLANTAR 06/24/2007   Past Medical History:  Diagnosis Date  . ALLERGIC RHINITIS   . Erectile dysfunction   . FASCIITIS, PLANTAR   . HYPERCHOLESTEROLEMIA   . HYPERLIPIDEMIA   . HYPERTENSION   . INTERNAL HEMORRHOIDS    Family History  Problem Relation Age of Onset  . Diabetes Mother   . Coronary artery disease Mother        hx CABG  . Coronary artery disease Father        Hx MI  . Prostate cancer Father   . Lung cancer Father   . Colon cancer Neg Hx   . Esophageal cancer Neg Hx   . Rectal cancer Neg Hx   . Stomach cancer Neg Hx    Past Surgical History:  Procedure Laterality Date  . FACIAL FRACTURE SURGERY    . Mass excision from Left forearm     lipoma   Social History   Occupational History  . Not on file.   Social History Main Topics  . Smoking status: Never Smoker  . Smokeless tobacco: Never Used  . Alcohol use 1.2 oz/week    2 Cans of beer per week  . Drug use: No  . Sexual activity: Not on file    OBJECTIVE:  VS:  HT:6' 5.5" (196.9 cm)   WT:259 lb 12.8 oz (117.8 kg)  BMI:30.4    BP:140/88  HR:84bpm  TEMP: ( )  RESP:95 % EXAM: Findings:  Adult male.  No acute distress.  Alert and appropriate.  His left shoulder is overall well aligned with no significant deformity.  He has pain with Hawkins as well as with any type of motion past 90 degrees of abduction.  His empty can test is painful but his strength is 4+/5.  External rotation and internal rotation strength is intact in essentially pain-free but intermittently is uncomfortable.  He has no pain with axial load and circumduction.  Negative speeds test O'Brien's testing.  Negative Spurling's compression test and Lhermitte's compression test.  No pain with arm squeeze test.     Mr Shoulder Left W Contrast  Result Date: 05/07/2017 CLINICAL DATA:  Lifting injury.  Arm weakness. EXAM: MR ARTHROGRAM OF  THE LEFT SHOULDER TECHNIQUE: Multiplanar, multisequence MR imaging of the left shoulder was performed following the administration of intra-articular contrast. CONTRAST:  See Injection Documentation. COMPARISON:  None. FINDINGS: Rotator cuff: Severe tendinosis of the supraspinatus tendon with a insertional interstitial tear and fraying along the bursal surface. Mild tendinosis of the infraspinatus tendon without a discrete tear. Teres minor tendon is intact. Subscapularis tendon is intact. Muscles: No atrophy or fatty replacement of nor abnormal signal within, the muscles of the rotator cuff. Biceps long head: Intact. Acromioclavicular Joint: Moderate arthropathy of the acromioclavicular joint. Type 1 acromion. No subacromial/ subdeltoid bursal fluid. Glenohumeral Joint: Intraarticular contrast distending the joint capsule. No chondral defect. Normal glenohumeral ligaments. Labrum: Intact. Bones: No acute osseous abnormality. IMPRESSION: 1. Severe tendinosis of the supraspinatus tendon with a insertional interstitial tear and fraying along the bursal surface. 2. Mild tendinosis of the infraspinatus tendon without a discrete tear. Electronically Signed   By: Elige KoHetal  Patel   On: 05/07/2017 14:50   ASSESSMENT & PLAN:     ICD-10-CM   1. Tendinopathy of left rotator cuff M67.912 Ambulatory referral to Physical Therapy  2. Left shoulder pain, unspecified chronicity M25.512    ================================================================= Tendinopathy of rotator cuff MR arthrogram confirms interstitial splitting of the supraspinatus tendon without overt full-thickness tearing.  Continue with nitroglycerin protocol, formal physical therapy and home exercise program.  Allowed to return to work at this time without significant restrictions and risks of this discussed in detail.  Other alternative interventions including PRP and Tenex were briefly discussed as well but at this time will continue with conservative  management given continued improvement.  Work release paperwork completed today.  >50% of this 25 minute visit spent in direct patient counseling and/or coordination of care.  Discussion was focused on education regarding the in discussing the pathoetiology and anticipated clinical course of the above condition.  Discussed multiple options including his opportunity return to work as well as Chief Strategy Officerconservative management as outlined above.  Surgical consultation was briefly discussed but he would like to decline at this time. =================================================================  Follow-up: Return in about 6 weeks (around 06/21/2017), or +++Schedule PT visit with Lauren+++.   CMA/ATC served as Neurosurgeonscribe during this visit. History, Physical, and Plan performed by medical provider. Documentation and orders reviewed and attested to.      Gaspar BiddingMichael Oluwatobi Ruppe, DO    [facility] Phys[facility]

## 2017-05-13 ENCOUNTER — Ambulatory Visit: Payer: BLUE CROSS/BLUE SHIELD

## 2017-05-14 ENCOUNTER — Ambulatory Visit (INDEPENDENT_AMBULATORY_CARE_PROVIDER_SITE_OTHER): Payer: BLUE CROSS/BLUE SHIELD | Admitting: Physical Therapy

## 2017-05-14 DIAGNOSIS — M25512 Pain in left shoulder: Secondary | ICD-10-CM | POA: Diagnosis not present

## 2017-05-14 NOTE — Therapy (Signed)
[facility] [address] [place], [place], [address] Phone: [phone]   Fax:  [phone]  Physical Therapy Evaluation  Patient Details  Name: Edward Reid MRN: [id number] Date of Birth: 1965/12/07 Referring Provider: Gaspar BiddingMichael Rigby  Encounter Date: 05/14/2017      PT End of Session - 05/14/17 1534    Visit Number 1   Number of Visits 12   Date for PT Re-Evaluation 06/25/17   Authorization Type BCBS   PT Start Time 0850   PT Stop Time 0925   PT Time Calculation (min) 35 min   Activity Tolerance Patient tolerated treatment well   Behavior During Therapy [facility] for tasks assessed/performed      Past Medical History:  Diagnosis Date  . ALLERGIC RHINITIS   . Erectile dysfunction   . FASCIITIS, PLANTAR   . HYPERCHOLESTEROLEMIA   . HYPERLIPIDEMIA   . HYPERTENSION   . INTERNAL HEMORRHOIDS     Past Surgical History:  Procedure Laterality Date  . FACIAL FRACTURE SURGERY    . Mass excision from Left forearm     lipoma    There were no vitals filed for this visit.       Subjective Assessment - 05/14/17 0850    Pertinent History Pt reports increased pain in L shoulder, for several months. He does have to lift at work, but does not recall any injury.  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 He has + MRA for mild rotator cuff pathology. He states difficulty with elevaton of shoulder. He has recently returned to gym/weight activities.    Limitations Lifting;House hold activities   Diagnostic tests MRA    Patient Stated Goals Decreased pain, improved ability to lift, increased strength of R arm    Currently in Pain? Yes   Pain Score 7    Pain Location Shoulder   Pain Orientation Left   Pain Descriptors / Indicators Aching   Pain Type Acute pain   Pain Onset More than a month ago   Pain Frequency Intermittent   Aggravating Factors  Liftting, elevatoin, work duties, exercising    Pain Relieving Factors Rest, biofreeze             [facility] PT Assessment - 05/14/17 0001      Assessment   Medical Diagnosis Left Rotator Cuff Tendinopathy   Referring Provider Gaspar BiddingMichael Rigby   Hand Dominance Right   Next MD Visit 10/19   Prior Therapy No     Precautions   Precautions None     Balance Screen   Has the patient fallen in the past 6 months No     Prior Function   Level of Independence Independent     ROM / Strength   AROM / PROM / Strength AROM;PROM;Strength     AROM   Overall AROM Comments R: WNL   AROM Assessment Site Shoulder   Right/Left Shoulder Left   Left Shoulder Flexion 140 Degrees   Left Shoulder ABduction 160 Degrees   Left Shoulder Internal Rotation 70 Degrees   Left Shoulder External Rotation 70 Degrees     PROM   PROM Assessment Site Shoulder   Right/Left Shoulder Left   Left Shoulder Flexion 160 Degrees   Left Shoulder ABduction 160 Degrees   Left Shoulder Internal Rotation 70 Degrees   Left Shoulder External Rotation 70 Degrees     Strength   Overall Strength Comments R: 5/5  Strength Assessment Site Shoulder   Right/Left Shoulder Left   Left Shoulder Flexion 4-/5   Left Shoulder ABduction 4-/5   Left Shoulder Internal Rotation 4/5   Left Shoulder External Rotation 4/5            Objective measurements completed on examination: See above findings.          [facility] Adult PT Treatment/Exercise - 05/14/17 0001      Exercises   Exercises Shoulder     Shoulder Exercises: Supine   Flexion AROM;AAROM;20 reps   Flexion Limitations AAROM with cane     Shoulder Exercises: Sidelying   External Rotation AROM;20 reps     Shoulder Exercises: Standing   Row 20 reps;Strengthening   Theraband Level (Shoulder Row) Level 2 (Red)                PT Education - 05/14/17 1531    Education provided Yes   Education Details HEP   Person(s) Educated Patient   Methods Explanation   Comprehension Verbalized understanding           PT Short Term Goals - 05/14/17 1541      PT SHORT TERM GOAL #1   Title Pt to be independent with initial HEP    Time 2   Period Weeks   Status New   Target Date 05/28/17     PT SHORT TERM GOAL #2   Title Pt to report decreased pain in L shoulder, to 4/10 with activity    Time 2   Period Weeks   Status New   Target Date 05/28/17           PT Long Term Goals - 05/14/17 1542      PT LONG TERM GOAL #1   Title Pt to demo decreased pain in L shoulder, to 0-2/10 with elevation and activity   Time 6   Period Weeks   Status New   Target Date 06/25/17     PT LONG TERM GOAL #2   Title Pt to demo improved AROM of L shoulder, to be WNL, to improve ability for work duties and [employer].    Time 6   Period Weeks   Status New   Target Date 06/25/17     PT LONG TERM GOAL #3   Title Pt to demo increased strength of L shoulder to be at least 4+/5, to improve ability for reaching, liftying activities and work duties    Time 6   Period Weeks   Status New   Target Date 06/25/17                Plan - 05/14/17 1536    Clinical Impression Statement Pt presents with primary complaint of increased pain in L shoulder, consistent with rotator cuff pathology. Pt with ROM loss for elevation, due to weakness and pain, as well as mild joint stiffness. Pt with decreased strength, and stability of L shoulder. He has decreased ability for full functional activities, reaching, lifting, carrying, elevation, and work duties. Pt to benefit from skilled care, to address deficits, and to improve functional use and pain.     Clinical Presentation Stable   Clinical Decision Making Low   Rehab Potential Good   PT Frequency 2x / week   PT Duration 6 weeks   PT Treatment/Interventions ADLs/Self Care Home Management;Cryotherapy;Electrical Stimulation;Iontophoresis /ml Dexamethasone;Moist Heat;Ultrasound;Neuromuscular re-education;Therapeutic exercise;Therapeutic activities;Functional  mobility training;Patient/family education;Manual techniques;Passive range of motion;Taping;Dry needling   PT Next Visit Plan Teach HEP for  shoulder strengthening and ROM.   PT Home Exercise Plan cane flexion, s/l ER, rows   Consulted and Agree with Plan of Care Patient      Patient will benefit from skilled therapeutic intervention in order to improve the following deficits and impairments:  Hypomobility, Decreased strength, Impaired UE functional use, Pain  Visit Diagnosis: Left shoulder pain, unspecified chronicity - Plan: PT plan of care cert/re-cert     Problem List Patient Active Problem List   Diagnosis Date Noted  . Tendinopathy of rotator cuff 12/12/2016  . Cough 08/11/2015  . Wheezing 08/11/2015  . Skin lesion 08/10/2014  . Preventative health care 01/16/2012  . Erectile dysfunction   . ORTHOSTATIC DIZZINESS 04/06/2010  . Essential hypertension 05/19/2008  . INTERNAL HEMORRHOIDS 04/13/2008  . HYPERCHOLESTEROLEMIA 06/24/2007  . Allergic rhinitis 06/24/2007  . FASCIITIS, PLANTAR 06/24/2007   Sedalia Muta, PT, DPT 3:49 PM  05/14/17    [facility] [address] [place], [place], [address] Phone: [phone]   Fax:  [phone]  Name: Edward Reid MRN: [id number] Date of Birth: Oct 29, 1965

## 2017-05-16 ENCOUNTER — Other Ambulatory Visit: Payer: BLUE CROSS/BLUE SHIELD

## 2017-05-24 ENCOUNTER — Ambulatory Visit (INDEPENDENT_AMBULATORY_CARE_PROVIDER_SITE_OTHER): Payer: BLUE CROSS/BLUE SHIELD | Admitting: Physical Therapy

## 2017-05-24 DIAGNOSIS — M25512 Pain in left shoulder: Secondary | ICD-10-CM | POA: Diagnosis not present

## 2017-05-24 NOTE — Therapy (Signed)
[facility] [address] [place], [place], [address] Phone: [phone]   Fax:  [phone]  Physical Therapy Treatment  Patient Details  Name: Edward Reid MRN: [id number] Date of Birth: Oct 31, 1965 Referring Provider: Gaspar Bidding  Encounter Date: 05/24/2017      PT End of Session - 05/24/17 1015    Visit Number 2   Number of Visits 12   Date for PT Re-Evaluation 06/25/17   Authorization Type BCBS   PT Start Time 0935   PT Stop Time 1013   PT Time Calculation (min) 38 min   Activity Tolerance Patient tolerated treatment well   Behavior During Therapy [facility] for tasks assessed/performed      Past Medical History:  Diagnosis Date  . ALLERGIC RHINITIS   . Erectile dysfunction   . FASCIITIS, PLANTAR   . HYPERCHOLESTEROLEMIA   . HYPERLIPIDEMIA   . HYPERTENSION   . INTERNAL HEMORRHOIDS     Past Surgical History:  Procedure Laterality Date  . FACIAL FRACTURE SURGERY    . Mass excision from Left forearm     lipoma    There were no vitals filed for this visit.      Subjective Assessment - 05/24/17 0931    Subjective Pt states that he has returned to work, and has slight soreness in shoulder. He has tried not to lift overhead.    Pain Score 0-No pain   Pain Location Shoulder                         [facility] Adult PT Treatment/Exercise - 05/24/17 0935      Exercises   Exercises Shoulder     Shoulder Exercises: Supine   Flexion AROM;AAROM;20 reps   Flexion Limitations AAROM with cane, AROM with 2 lb bil   Other Supine Exercises SA punch x20, 2 lb Bil     Shoulder Exercises: Sidelying   External Rotation AROM;20 reps   External Rotation Weight (lbs) 2lb x10, 1lb x10   Other Sidelying Exercises horizontal ABduction 2x10 for scap stab     Shoulder Exercises: Standing   External Rotation Strengthening;20 reps   Theraband Level (Shoulder External Rotation) Level 2 (Red)   Internal Rotation  Strengthening;20 reps   Theraband Level (Shoulder Internal Rotation) Level 2 (Red)   Row 20 reps;Strengthening   Theraband Level (Shoulder Row) Level 3 (Green)     Shoulder Exercises: Pulleys   Flexion 2 minutes     Manual Therapy   Manual Therapy Passive ROM   Passive ROM All directions, Pec stretch,                 PT Education - 05/24/17 0934    Education provided Yes   Education Details On exercise mechanics.    Person(s) Educated Patient   Methods Explanation   Comprehension Verbalized understanding          PT Short Term Goals - 05/14/17 1541      PT SHORT TERM GOAL #1   Title Pt to be independent with initial HEP    Time 2   Period Weeks   Status New   Target Date 05/28/17     PT SHORT TERM GOAL #2   Title Pt to report decreased pain in L shoulder, to 4/10 with activity    Time 2   Period Weeks   Status New   Target Date 05/28/17  PT Long Term Goals - 05/14/17 1542      PT LONG TERM GOAL #1   Title Pt to demo decreased pain in L shoulder, to 0-2/10 with elevation and activity   Time 6   Period Weeks   Status New   Target Date 06/25/17     PT LONG TERM GOAL #2   Title Pt to demo improved AROM of L shoulder, to be WNL, to improve ability for work duties and [employer].    Time 6   Period Weeks   Status New   Target Date 06/25/17     PT LONG TERM GOAL #3   Title Pt to demo increased strength of L shoulder to be at least 4+/5, to improve ability for reaching, liftying activities and work duties    Time 6   Period Weeks   Status New   Target Date 06/25/17               Plan - 05/24/17 1206    Clinical Impression Statement Pt able to progress ther ex for light strengthening and stablization today, without increased pain. He does have fatigue with ER and repeated motions, but minimal soreness. Plan to progress strength as tolerated.    Rehab Potential Good   PT Frequency 2x / week   PT Duration 6 weeks   PT  Treatment/Interventions ADLs/Self Care Home Management;Cryotherapy;Electrical Stimulation;Iontophoresis /ml Dexamethasone;Moist Heat;Ultrasound;Neuromuscular re-education;Therapeutic exercise;Therapeutic activities;Functional mobility training;Patient/family education;Manual techniques;Passive range of motion;Taping;Dry needling   PT Next Visit Plan Teach HEP for shoulder strengthening and ROM.   PT Home Exercise Plan cane flexion, s/l ER, rows   Consulted and Agree with Plan of Care Patient      Patient will benefit from skilled therapeutic intervention in order to improve the following deficits and impairments:  Hypomobility, Decreased strength, Impaired UE functional use, Pain  Visit Diagnosis: Left shoulder pain, unspecified chronicity     Problem List Patient Active Problem List   Diagnosis Date Noted  . Tendinopathy of rotator cuff 12/12/2016  . Cough 08/11/2015  . Wheezing 08/11/2015  . Skin lesion 08/10/2014  . Preventative health care 01/16/2012  . Erectile dysfunction   . ORTHOSTATIC DIZZINESS 04/06/2010  . Essential hypertension 05/19/2008  . INTERNAL HEMORRHOIDS 04/13/2008  . HYPERCHOLESTEROLEMIA 06/24/2007  . Allergic rhinitis 06/24/2007  . FASCIITIS, PLANTAR 06/24/2007   Sedalia Muta, PT, DPT 12:07 PM  05/24/17    [facility] [address] [place], [place], [address] Phone: [phone]   Fax:  [phone]  Name: Edward Reid MRN: [id number] Date of Birth: 1966/01/04

## 2017-06-05 ENCOUNTER — Telehealth: Payer: Self-pay | Admitting: Physical Therapy

## 2017-06-05 NOTE — Telephone Encounter (Signed)
Spoke to pt about no show for appt today.  Pt reports his phone died and he overslept.  Rescheduled appt for next Monday at 8:00.  Clarita Crane, PT, DPT 06/05/17 9:41 AM

## 2017-06-10 ENCOUNTER — Ambulatory Visit (INDEPENDENT_AMBULATORY_CARE_PROVIDER_SITE_OTHER): Payer: BLUE CROSS/BLUE SHIELD | Admitting: Physical Therapy

## 2017-06-10 DIAGNOSIS — M25512 Pain in left shoulder: Secondary | ICD-10-CM | POA: Diagnosis not present

## 2017-06-10 NOTE — Patient Instructions (Signed)
Flexion - Supine (Dumbbell)    Lie with arms along body. Lift arms and shoulders straight up, palms forward. Repeat __20__ times per set. Do _1___ sets per session. Do __6-7__ sessions per week. Use _3___ lb weights.  Elbow Extension: Press - Resisted    Lie on back, __5__ pound weight in left hand, arm out to side, elbow bent. Press arm toward ceiling. Return slowly. Repeat __20__ times per set. Do __1__ sets per session. Do __1-2__ sessions per day.  Abduction - Side-Lying (Dumbbell)    Lie with neck supported, right arm on hip. Lift straight arm toward ceiling. Repeat _20___ times per set. Do __1__ sets per session. Do _6-7___ sessions per week. Use _3___ lb weight.    External Rotation: Side-Lying (Dumbbell)    Lie with neck supported, left elbow bent to 90, forearm across stomach. Raise forearm, keeping elbow at side. Repeat __20__ times per set. Do __1__ sets per session. Do __6-7__ sessions per week. Use _3__ lb weight.   Scapular Retraction: Rowing (Eccentric) - Arms - Side (Resistance Band)    Hold end of band in each hand. Pull back until elbows are even with trunk. Keep elbows by sides, thumbs up. Slowly release for 3-5 seconds. Use __green___ resistance band. _20__ reps per set, _1-2__ sets per day, _6-7__ days per week.   Strengthening: Resisted Extension    Hold tubing in both hands, arm forward. Pull arm back, elbow straight. Repeat __20__ times per set. Do __1__ sets per session. Do __1-2__ sessions per day.

## 2017-06-10 NOTE — Therapy (Signed)
[facility] [address] [place], [place], [address] Phone: [phone]   Fax:  [phone]  Physical Therapy Treatment  Patient Details  Name: Edward Reid MRN: [id number] Date of Birth: 27-Dec-1965 Referring Provider: Gaspar Bidding  Encounter Date: 06/10/2017      PT End of Session - 06/10/17 0846    Visit Number 3   Number of Visits 12   Date for PT Re-Evaluation 06/25/17   Authorization Type BCBS   PT Start Time 0803   PT Stop Time 0844   PT Time Calculation (min) 41 min   Activity Tolerance Patient tolerated treatment well   Behavior During Therapy [facility] for tasks assessed/performed      Past Medical History:  Diagnosis Date  . ALLERGIC RHINITIS   . Erectile dysfunction   . FASCIITIS, PLANTAR   . HYPERCHOLESTEROLEMIA   . HYPERLIPIDEMIA   . HYPERTENSION   . INTERNAL HEMORRHOIDS     Past Surgical History:  Procedure Laterality Date  . FACIAL FRACTURE SURGERY    . Mass excision from Left forearm     lipoma    There were no vitals filed for this visit.      Subjective Assessment - 06/10/17 0805    Subjective "It has moments when it feels like a toothache." has been doing some overhead lifting at work and it's going okay.   Patient Stated Goals Decreased pain, improved ability to lift, increased strength of R arm    Currently in Pain? No/denies                         [facility] Adult PT Treatment/Exercise - 06/10/17 0807      Shoulder Exercises: Supine   Protraction Left;20 reps;Weights   Protraction Weight (lbs) 5   Flexion Left;20 reps;Weights   Shoulder Flexion Weight (lbs) 3     Shoulder Exercises: Sidelying   External Rotation Left;20 reps;Weights   External Rotation Weight (lbs) 3   ABduction Left;20 reps;Weights   ABduction Weight (lbs) 3   ABduction Limitations to ~ 80-90 degrees only     Shoulder Exercises: Standing   Extension Both;20 reps;Theraband   Theraband Level (Shoulder  Extension) Level 3 (Green)   Row 20 reps;Strengthening   Theraband Level (Shoulder Row) Level 3 (Green)     Shoulder Exercises: Pulleys   Flexion 2 minutes     Shoulder Exercises: Stretch   Other Shoulder Stretches low doorway stretch 3x30 sec                PT Education - 06/10/17 0845    Education provided Yes   Education Details updated HEP   Person(s) Educated Patient   Methods Explanation;Demonstration;Handout   Comprehension Verbalized understanding;Returned demonstration;Need further instruction          PT Short Term Goals - 06/10/17 0846      PT SHORT TERM GOAL #1   Title Pt to be independent with initial HEP    Status Achieved     PT SHORT TERM GOAL #2   Title Pt to report decreased pain in L shoulder, to 4/10 with activity    Status Achieved           PT Long Term Goals - 05/14/17 1542      PT LONG TERM GOAL #1   Title Pt to demo decreased pain in L shoulder, to 0-2/10 with elevation and activity   Time 6   Period Weeks  Status New   Target Date 06/25/17     PT LONG TERM GOAL #2   Title Pt to demo improved AROM of L shoulder, to be WNL, to improve ability for work duties and [employer].    Time 6   Period Weeks   Status New   Target Date 06/25/17     PT LONG TERM GOAL #3   Title Pt to demo increased strength of L shoulder to be at least 4+/5, to improve ability for reaching, liftying activities and work duties    Time 6   Period Weeks   Status New   Target Date 06/25/17               Plan - 06/10/17 0846    Clinical Impression Statement Pt with improving ROM with conitnued c/o weakness.  Progressed HEP today to progress strengthening exercises with min increase in pain which resolved quickly.  Will continue to benefit from PT to maximize function.   PT Treatment/Interventions ADLs/Self Care Home Management;Cryotherapy;Electrical Stimulation;Iontophoresis /ml Dexamethasone;Moist Heat;Ultrasound;Neuromuscular  re-education;Therapeutic exercise;Therapeutic activities;Functional mobility training;Patient/family education;Manual techniques;Passive range of motion;Taping;Dry needling   PT Next Visit Plan review updated HEP, continue strengthening and scap stabilization   Consulted and Agree with Plan of Care Patient      Patient will benefit from skilled therapeutic intervention in order to improve the following deficits and impairments:  Hypomobility, Decreased strength, Impaired UE functional use, Pain  Visit Diagnosis: Left shoulder pain, unspecified chronicity     Problem List Patient Active Problem List   Diagnosis Date Noted  . Tendinopathy of rotator cuff 12/12/2016  . Cough 08/11/2015  . Wheezing 08/11/2015  . Skin lesion 08/10/2014  . Preventative health care 01/16/2012  . Erectile dysfunction   . ORTHOSTATIC DIZZINESS 04/06/2010  . Essential hypertension 05/19/2008  . INTERNAL HEMORRHOIDS 04/13/2008  . HYPERCHOLESTEROLEMIA 06/24/2007  . Allergic rhinitis 06/24/2007  . Rebecka Apley 06/24/2007      Clarita Crane, PT, DPT 06/10/17 8:49 AM    [facility] [address] [place], [place], [address] Phone: [phone]   Fax:  [phone]  Name: Edward Reid MRN: [id number] Date of Birth: [birth year]

## 2017-06-19 ENCOUNTER — Ambulatory Visit (INDEPENDENT_AMBULATORY_CARE_PROVIDER_SITE_OTHER): Payer: BLUE CROSS/BLUE SHIELD | Admitting: Physical Therapy

## 2017-06-19 DIAGNOSIS — M25512 Pain in left shoulder: Secondary | ICD-10-CM

## 2017-06-19 NOTE — Therapy (Signed)
[facility] [address] [place], [place], [address] Phone: [phone]   Fax:  [phone]  Physical Therapy Treatment  Patient Details  Name: Edward Reid MRN: [id number] Date of Birth: Nov 02, 1965 Referring Provider: Gaspar Bidding  Encounter Date: 06/19/2017      PT End of Session - 06/19/17 0929    Visit Number 4   Number of Visits 12   Date for PT Re-Evaluation 06/25/17   Authorization Type BCBS   PT Start Time [phone]   PT Stop Time 0930   PT Time Calculation (min) 38 min   Activity Tolerance Patient tolerated treatment well   Behavior During Therapy [facility] for tasks assessed/performed      Past Medical History:  Diagnosis Date  . ALLERGIC RHINITIS   . Erectile dysfunction   . FASCIITIS, PLANTAR   . HYPERCHOLESTEROLEMIA   . HYPERLIPIDEMIA   . HYPERTENSION   . INTERNAL HEMORRHOIDS     Past Surgical History:  Procedure Laterality Date  . FACIAL FRACTURE SURGERY    . Mass excision from Left forearm     lipoma    There were no vitals filed for this visit.      Subjective Assessment - 06/19/17 0900    Subjective shoulder is only feeling aching rather than pain.  notices it more with the rain.   Patient Stated Goals Decreased pain, improved ability to lift, increased strength of R arm    Currently in Pain? Yes   Pain Score 2    Pain Location Shoulder   Pain Orientation Left   Pain Descriptors / Indicators Aching   Pain Type Acute pain   Pain Onset More than a month ago   Pain Frequency Intermittent   Aggravating Factors  lifting, elevation, work duties, exercises   Pain Relieving Factors rest, biofreeze                         [facility] Adult PT Treatment/Exercise - 06/19/17 0901      Shoulder Exercises: Supine   Protraction Left;20 reps;Weights   Protraction Weight (lbs) 5   Flexion Left;20 reps;Weights   Shoulder Flexion Weight (lbs) 3     Shoulder Exercises: Prone   Retraction Left;20  reps;Weights   Retraction Weight (lbs) 3   Flexion Left;20 reps;Weights   Flexion Weight (lbs) 1   Flexion Limitations c/o tightness; in scapular plane   Extension Left;20 reps;Weights   Extension Weight (lbs) 3   Horizontal ABduction 1 Left;20 reps;Weights   Horizontal ABduction 1 Weight (lbs) 1     Shoulder Exercises: Sidelying   External Rotation Left;20 reps;Weights   External Rotation Weight (lbs) 3   ABduction Left;20 reps;Weights   ABduction Weight (lbs) 3   ABduction Limitations to 90 degrees     Shoulder Exercises: Standing   Extension Both;20 reps;Theraband   Theraband Level (Shoulder Extension) Level 3 (Green)   Row 20 reps;Strengthening;Theraband   Theraband Level (Shoulder Row) Level 3 (Green)     Shoulder Exercises: Pulleys   Flexion 2 minutes   ABduction 2 minutes                  PT Short Term Goals - 06/10/17 0846      PT SHORT TERM GOAL #1   Title Pt to be independent with initial HEP    Status Achieved     PT SHORT TERM GOAL #2   Title Pt to report decreased pain in L shoulder,  to 4/10 with activity    Status Achieved           PT Long Term Goals - 05/14/17 1542      PT LONG TERM GOAL #1   Title Pt to demo decreased pain in L shoulder, to 0-2/10 with elevation and activity   Time 6   Period Weeks   Status New   Target Date 06/25/17     PT LONG TERM GOAL #2   Title Pt to demo improved AROM of L shoulder, to be WNL, to improve ability for work duties and [employer].    Time 6   Period Weeks   Status New   Target Date 06/25/17     PT LONG TERM GOAL #3   Title Pt to demo increased strength of L shoulder to be at least 4+/5, to improve ability for reaching, liftying activities and work duties    Time 6   Period Weeks   Status New   Target Date 06/25/17               Plan - 06/19/17 0931    Clinical Impression Statement Pt reports his HEP was damaged by water last week so only working from [employer]memory but tolerated exercises  well.  Reports muscle fatigue with strengthening but overall doing well.  Progressing well towards goals.  Decreasing frequency to biweekly with hopeful transition to HEP soon.   PT Treatment/Interventions ADLs/Self Care Home Management;Cryotherapy;Electrical Stimulation;Iontophoresis 4mg /ml Dexamethasone;Moist Heat;Ultrasound;Neuromuscular re-education;Therapeutic exercise;Therapeutic activities;Functional mobility training;Patient/family education;Manual techniques;Passive range of motion;Taping;Dry needling   PT Next Visit Plan continue strengthening and scap stabilization   Consulted and Agree with Plan of Care Patient      Patient will benefit from skilled therapeutic intervention in order to improve the following deficits and impairments:  Hypomobility, Decreased strength, Impaired UE functional use, Pain  Visit Diagnosis: Left shoulder pain, unspecified chronicity     Problem List Patient Active Problem List   Diagnosis Date Noted  . Tendinopathy of rotator cuff 12/12/2016  . Cough 08/11/2015  . Wheezing 08/11/2015  . Skin lesion 08/10/2014  . Preventative health care 01/16/2012  . Erectile dysfunction   . ORTHOSTATIC DIZZINESS 04/06/2010  . Essential hypertension 05/19/2008  . INTERNAL HEMORRHOIDS 04/13/2008  . HYPERCHOLESTEROLEMIA 06/24/2007  . Allergic rhinitis 06/24/2007  . Rebecka ApleyFASCIITIS, PLANTAR 06/24/2007      Clarita CraneStephanie F Concha Sudol, PT, DPT 06/19/17 9:35 AM    [facility] [address] [address] [place], [place], [address] Phone: [phone]   Fax:  [phone]  Name: Edward Reid MRN: [id number] Date of Birth: October 17, 1965

## 2017-06-21 ENCOUNTER — Ambulatory Visit (INDEPENDENT_AMBULATORY_CARE_PROVIDER_SITE_OTHER): Payer: BLUE CROSS/BLUE SHIELD | Admitting: Sports Medicine

## 2017-06-21 ENCOUNTER — Encounter: Payer: Self-pay | Admitting: Sports Medicine

## 2017-06-21 VITALS — BP 120/84 | HR 83 | Ht 77.5 in | Wt 256.8 lb

## 2017-06-21 DIAGNOSIS — M67912 Unspecified disorder of synovium and tendon, left shoulder: Secondary | ICD-10-CM

## 2017-06-21 NOTE — Progress Notes (Signed)
OFFICE VISIT NOTE Edward FellsMichael D. Delorise [name], DO  [facility] [place] [phone]  Edward Reid - 51 y.o. male MRN [id number]  Date of birth: [birth year]  Visit Date: 06/21/2017  PCP: Edward Reid   Referred by: Edward Reid  Stevenson ClinchBrandy Reid, CMA acting as scribe for Dr. [name].  SUBJECTIVE:   Chief Complaint  Patient presents with  . Follow-up    LT shoulder pain   HPI: As below and per problem based documentation when appropriate.  Edward Reid is an established patient presenting today in follow-up of LT shoulder pain. He was last seen 05/10/2017 and was advised to continue following Nitro Protocol. He was referred to PT and encouraged to continue home therapeutic exercises.   Pt says that shoulder pain comes and goes "like a tooth ache". PT seems to be helping but he is still have pain occasionally. He feels that he has been pretty good at work, he asks for assistance if he doesn't feel like he can lift something. He has been trying to make it to the gym to work on lat and row machines. He is trying to stick with lighter weights. He has been wearing Nitro patches every other day because at times they do cause HA. He feels like he continues to have decreased ROM.     Review of Systems  Constitutional: Negative for chills, fever and malaise/fatigue.  Respiratory: Negative for shortness of breath and wheezing.   Cardiovascular: Positive for chest pain (2 days ago, comes and goes). Negative for palpitations.  Musculoskeletal: Positive for joint pain.  Neurological: Positive for dizziness and headaches. Negative for tingling and weakness.    Otherwise per HPI.  HISTORY & PERTINENT PRIOR DATA:  No specialty comments available. He reports that he has never smoked. He has never used smokeless tobacco. No results for input(s): HGBA1C, LABURIC in the last 8760 hours. Allergies reviewed per EMR Prior to Admission medications   Medication Sig Start  Date End Date Taking? Authorizing Provider  aspirin EC 81 MG tablet Take 1 tablet (81 mg total) by mouth daily. 08/10/14  Yes Edward Reid  atorvastatin (LIPITOR) 20 MG tablet Take 1 tablet (20 mg total) by mouth daily. 12/13/16 12/13/17 Yes Edward Reid  budesonide-formoterol [facility](SYMBICORT) 160-4.5 MCG/ACT inhaler Inhale 2 puffs into the lungs 2 (two) times daily. 12/12/16  Yes Edward Reid  nitroGLYCERIN (NITRODUR - DOSED IN MG/24 HR) 0.2 mg/hr patch  04/06/17  Yes Provider, Historical, Reid  NON FORMULARY Mucous relief pe   Yes Provider, Historical, Reid   Patient Active Problem List   Diagnosis Date Noted  . Tendinopathy of rotator cuff 12/12/2016  . Cough 08/11/2015  . Wheezing 08/11/2015  . Skin lesion 08/10/2014  . Preventative health care 01/16/2012  . Erectile dysfunction   . ORTHOSTATIC DIZZINESS 04/06/2010  . Essential hypertension 05/19/2008  . INTERNAL HEMORRHOIDS 04/13/2008  . HYPERCHOLESTEROLEMIA 06/24/2007  . Allergic rhinitis 06/24/2007  . FASCIITIS, PLANTAR 06/24/2007   Past Medical History:  Diagnosis Date  . ALLERGIC RHINITIS   . Erectile dysfunction   . FASCIITIS, PLANTAR   . HYPERCHOLESTEROLEMIA   . HYPERLIPIDEMIA   . HYPERTENSION   . INTERNAL HEMORRHOIDS    Family History  Problem Relation Age of Onset  . Diabetes Mother   . Coronary artery disease Mother        hx CABG  . Coronary artery disease Father  Hx MI  . Prostate cancer Father   . Lung cancer Father   . Colon cancer Neg Hx   . Esophageal cancer Neg Hx   . Rectal cancer Neg Hx   . Stomach cancer Neg Hx    Past Surgical History:  Procedure Laterality Date  . FACIAL FRACTURE SURGERY    . Mass excision from Left forearm     lipoma   Social History   Occupational History  . Not on file.   Social History Main Topics  . Smoking status: Never Smoker  . Smokeless tobacco: Never Used  . Alcohol use 1.2 oz/week    2 Cans of beer per week  . Drug use: No  . Sexual activity:  Not on file    OBJECTIVE:  VS:  HT:6' 5.5" (196.9 cm)   WT:256 lb 12.8 oz (116.5 kg)  BMI:30.05    BP:120/84  HR:83bpm  TEMP: ( )  RESP:98 % EXAM: Findings:  Full overhead range of motion, overall strength is 5 out of 5 with internal rotation, external rotation, empty can, speeds O'Brien's testing.  He has only minimal pain with resisted external rotation.  No significant pain or crepitation with axial load and circumduction.  No focal bony tenderness.  No significant upper extremity edema.    RADIOLOGY: MR SHOULDER LEFT W CONTRAST CLINICAL DATA:  Lifting injury.  Arm weakness.  EXAM: MR ARTHROGRAM OF THE LEFT SHOULDER  TECHNIQUE: Multiplanar, multisequence MR imaging of the left shoulder was performed following the administration of intra-articular contrast.  CONTRAST:  See Injection Documentation.  COMPARISON:  None.  FINDINGS: Rotator cuff: Severe tendinosis of the supraspinatus tendon with a insertional interstitial tear and fraying along the bursal surface. Mild tendinosis of the infraspinatus tendon without a discrete tear. Teres minor tendon is intact. Subscapularis tendon is intact.  Muscles: No atrophy or fatty replacement of nor abnormal signal within, the muscles of the rotator cuff.  Biceps long head: Intact.  Acromioclavicular Joint: Moderate arthropathy of the acromioclavicular joint. Type 1 acromion. No subacromial/ subdeltoid bursal fluid.  Glenohumeral Joint: Intraarticular contrast distending the joint capsule. No chondral defect. Normal glenohumeral ligaments.  Labrum: Intact.  Bones: No acute osseous abnormality.  IMPRESSION: 1. Severe tendinosis of the supraspinatus tendon with a insertional interstitial tear and fraying along the bursal surface. 2. Mild tendinosis of the infraspinatus tendon without a discrete tear.  Electronically Signed   By: Elige Ko   On: 05/07/2017 14:50  ASSESSMENT & PLAN:     ICD-10-CM   1.  Tendinopathy of left rotator cuff M67.912    ================================================================= Tendinopathy of rotator cuff He has progressed quite well over the past several weeks.  He is continuing to have some intermittent pain but this is only mild in nature.  And does not significantly limit his activities.  He is continuing with his home exercise program.  I would like for him to continue with physical therapy for the next several weeks every other week as scheduled and will plan to check in with him in 3 months to ensure clinical resolution.  ================================================================= Future Appointments Date Time Provider Department Center  07/03/2017 8:45 AM [facility] THERAPIST A [facility] None  07/22/2017 8:40 AM Andrena Mews, DO [facility] None  12/19/2017 8:40 AM Edward Levins, Reid [facility]    Follow-up: Return in about 3 months (around 09/21/2017).   CMA/ATC served as Neurosurgeon during this visit. History, Physical, and Plan performed by medical provider. Documentation and orders reviewed  and attested to.      Gaspar Bidding, DO    [facility] Phys[facility]

## 2017-06-21 NOTE — Assessment & Plan Note (Signed)
He has progressed quite well over the past several weeks.  He is continuing to have some intermittent pain but this is only mild in nature.  And does not significantly limit his activities.  He is continuing with his home exercise program.  I would like for him to continue with physical therapy for the next several weeks every other week as scheduled and will plan to check in with him in 3 months to ensure clinical resolution.

## 2017-07-18 ENCOUNTER — Encounter: Payer: Self-pay | Admitting: Physical Therapy

## 2017-07-18 ENCOUNTER — Ambulatory Visit (INDEPENDENT_AMBULATORY_CARE_PROVIDER_SITE_OTHER): Payer: BLUE CROSS/BLUE SHIELD | Admitting: Physical Therapy

## 2017-07-18 DIAGNOSIS — M25512 Pain in left shoulder: Secondary | ICD-10-CM

## 2017-07-18 NOTE — Therapy (Signed)
[place] [address] [place], [place], [address] Phone: [phone]   Fax:  [phone]  Physical Therapy Treatment  Patient Details  Name: Edward Reid MRN: [id number] Date of Birth: 1965/12/26 Referring Provider: Teresa Coombs   Encounter Date: 07/18/2017  PT End of Session - 07/18/17 0913    Visit Number  5    Authorization Type  BCBS    PT Start Time  0848    PT Stop Time  0923    PT Time Calculation (min)  35 min    Activity Tolerance  Patient tolerated treatment well    Behavior During Therapy  [facility] for tasks assessed/performed       Past Medical History:  Diagnosis Date  . ALLERGIC RHINITIS   . Erectile dysfunction   . FASCIITIS, PLANTAR   . HYPERCHOLESTEROLEMIA   . HYPERLIPIDEMIA   . HYPERTENSION   . INTERNAL HEMORRHOIDS     Past Surgical History:  Procedure Laterality Date  . FACIAL FRACTURE SURGERY    . Mass excision from Left forearm     lipoma    There were no vitals filed for this visit.  Subjective Assessment - 07/18/17 0847    Subjective  has been dealing with a lot of concerns at work, so hasn't had a lot of time for exercises.  having tooothache pain lately in Lt shoulder due to weather and arthritis    Patient Stated Goals  Decreased pain, improved ability to lift, increased strength of R arm     Currently in Pain?  Yes    Pain Score  5     Pain Location  Shoulder    Pain Orientation  Left    Pain Descriptors / Indicators  Aching    Pain Type  Acute pain    Pain Onset  More than a month ago    Pain Frequency  Intermittent    Aggravating Factors   lifting, elevation, work duties, exercises    Pain Relieving Factors  rest, biofreeze         [facility] PT Assessment - 07/18/17 0900      AROM   Overall AROM Comments  Lt shoulder WNL      Strength   Left Shoulder Flexion  5/5    Left Shoulder ABduction  5/5    Left Shoulder Internal Rotation  5/5    Left Shoulder External Rotation  4/5 due  to pain                  [facility] Adult PT Treatment/Exercise - 07/18/17 0905      Shoulder Exercises: Supine   Protraction  Left;20 reps;Weights    Protraction Weight (lbs)  5    Flexion  Left;20 reps;Weights    Shoulder Flexion Weight (lbs)  3      Shoulder Exercises: Sidelying   External Rotation  Left;20 reps;Weights    External Rotation Weight (lbs)  3    ABduction  Left;20 reps;Weights    ABduction Weight (lbs)  3    ABduction Limitations  to 90 degrees               PT Short Term Goals - 06/10/17 0846      PT SHORT TERM GOAL #1   Title  Pt to be independent with initial HEP     Status  Achieved      PT SHORT TERM GOAL #2   Title  Pt to report decreased pain in L  shoulder, to 4/10 with activity     Status  Achieved        PT Long Term Goals - 07/18/17 0913      PT LONG TERM GOAL #1   Title  Pt to demo decreased pain in L shoulder, to 0-2/10 with elevation and activity    Baseline  11/15: pain variable; some days pain up to 5-7/10    Status  Partially Met      PT LONG TERM GOAL #2   Title  Pt to demo improved AROM of L shoulder, to be WNL, to improve ability for work duties and [employer].     Status  Achieved      PT LONG TERM GOAL #3   Title  Pt to demo increased strength of L shoulder to be at least 4+/5, to improve ability for reaching, liftying activities and work duties     Baseline  11/15: all met except external rotation    Status  Partially Met            Plan - 07/18/17 0915    Clinical Impression Statement  Pt requesting d/c at this time due to increased work responsibilities during the holidays and pt has HEP that he knows he needs to complete at home (limited compliance at this time).  Will follow up with Dr Paulla Fore in Jan and if he needs to return will plan for new evaluation.      PT Next Visit Plan  d/c PT today    Consulted and Agree with Plan of Care  Patient       Patient will benefit from skilled therapeutic intervention  in order to improve the following deficits and impairments:  Hypomobility, Decreased strength, Impaired UE functional use, Pain  Visit Diagnosis: Left shoulder pain, unspecified chronicity - Plan: PT plan of care cert/re-cert     Problem List Patient Active Problem List   Diagnosis Date Noted  . Tendinopathy of rotator cuff 12/12/2016  . Cough 08/11/2015  . Wheezing 08/11/2015  . Skin lesion 08/10/2014  . Preventative health care 01/16/2012  . Erectile dysfunction   . ORTHOSTATIC DIZZINESS 04/06/2010  . Essential hypertension 05/19/2008  . INTERNAL HEMORRHOIDS 04/13/2008  . HYPERCHOLESTEROLEMIA 06/24/2007  . Allergic rhinitis 06/24/2007  . Feliberto Harts 06/24/2007      Laureen Abrahams, PT, DPT 07/18/17 9:27 AM    [facility] [address] [place], [place], [address] Phone: [phone]   Fax:  [phone]  Name: DUKE WEISENSEL MRN: [id number] Date of Birth: 03-12-1966      PHYSICAL THERAPY DISCHARGE SUMMARY  Visits from Start of Care: 5  Current functional level related to goals / functional outcomes: See above   Remaining deficits: contiues to c/o "toothache" pain in Lt shoulder, and he feels this may be due to arthritis.  Pt has HEP to continue at home, but has reported minimal compliance at this time.   Education / Equipment: HEP.  Plan: Patient agrees to discharge.  Patient goals were partially met. Patient is being discharged due to being pleased with the current functional level.  ?????     Laureen Abrahams, PT, DPT 07/18/17 9:28 AM   [place] [address] [place], [place], [address] Phone: [phone]  Fax: [phone]

## 2017-07-22 ENCOUNTER — Ambulatory Visit: Payer: BLUE CROSS/BLUE SHIELD | Admitting: Sports Medicine

## 2017-09-20 ENCOUNTER — Ambulatory Visit: Payer: BLUE CROSS/BLUE SHIELD | Admitting: Sports Medicine

## 2017-10-21 ENCOUNTER — Ambulatory Visit: Payer: BLUE CROSS/BLUE SHIELD | Admitting: Sports Medicine

## 2017-10-25 ENCOUNTER — Encounter: Payer: BLUE CROSS/BLUE SHIELD | Admitting: Sports Medicine

## 2017-10-25 NOTE — Progress Notes (Signed)
Erroneous encounter

## 2017-12-19 ENCOUNTER — Ambulatory Visit (INDEPENDENT_AMBULATORY_CARE_PROVIDER_SITE_OTHER): Payer: BLUE CROSS/BLUE SHIELD | Admitting: Internal Medicine

## 2017-12-19 ENCOUNTER — Other Ambulatory Visit (INDEPENDENT_AMBULATORY_CARE_PROVIDER_SITE_OTHER): Payer: BLUE CROSS/BLUE SHIELD

## 2017-12-19 ENCOUNTER — Encounter: Payer: Self-pay | Admitting: Internal Medicine

## 2017-12-19 VITALS — BP 122/84 | HR 81 | Temp 97.6°F | Ht 77.5 in | Wt 256.0 lb

## 2017-12-19 DIAGNOSIS — R739 Hyperglycemia, unspecified: Secondary | ICD-10-CM

## 2017-12-19 DIAGNOSIS — Z0000 Encounter for general adult medical examination without abnormal findings: Secondary | ICD-10-CM

## 2017-12-19 DIAGNOSIS — E7800 Pure hypercholesterolemia, unspecified: Secondary | ICD-10-CM | POA: Diagnosis not present

## 2017-12-19 LAB — URINALYSIS, ROUTINE W REFLEX MICROSCOPIC
BILIRUBIN URINE: NEGATIVE
KETONES UR: NEGATIVE
LEUKOCYTES UA: NEGATIVE
NITRITE: NEGATIVE
Specific Gravity, Urine: 1.025 (ref 1.000–1.030)
TOTAL PROTEIN, URINE-UPE24: NEGATIVE
Urine Glucose: NEGATIVE
Urobilinogen, UA: 0.2 (ref 0.0–1.0)
pH: 5.5 (ref 5.0–8.0)

## 2017-12-19 LAB — LIPID PANEL
Cholesterol: 158 mg/dL (ref 0–200)
HDL: 36.6 mg/dL — ABNORMAL LOW (ref >39.00)
LDL CALC: 99 mg/dL (ref 0–99)
NONHDL: 121.85
Total CHOL/HDL Ratio: 4
Triglycerides: 113 mg/dL (ref 0.0–149.0)
VLDL: 22.6 mg/dL (ref 0.0–40.0)

## 2017-12-19 LAB — PSA: PSA: 1.14 ng/mL (ref 0.10–4.00)

## 2017-12-19 LAB — CBC WITH DIFFERENTIAL/PLATELET
BASOS ABS: 0 10*3/uL (ref 0.0–0.1)
BASOS PCT: 0.5 % (ref 0.0–3.0)
Eosinophils Absolute: 0.3 10*3/uL (ref 0.0–0.7)
Eosinophils Relative: 4.2 % (ref 0.0–5.0)
HEMATOCRIT: 46 % (ref 39.0–52.0)
Hemoglobin: 15.3 g/dL (ref 13.0–17.0)
LYMPHS PCT: 32.5 % (ref 12.0–46.0)
Lymphs Abs: 2.1 10*3/uL (ref 0.7–4.0)
MCHC: 33.2 g/dL (ref 30.0–36.0)
MCV: 84.7 fl (ref 78.0–100.0)
MONO ABS: 0.7 10*3/uL (ref 0.1–1.0)
Monocytes Relative: 11.2 % (ref 3.0–12.0)
NEUTROS ABS: 3.3 10*3/uL (ref 1.4–7.7)
NEUTROS PCT: 51.6 % (ref 43.0–77.0)
PLATELETS: 216 10*3/uL (ref 150.0–400.0)
RBC: 5.43 Mil/uL (ref 4.22–5.81)
RDW: 13.8 % (ref 11.5–15.5)
WBC: 6.5 10*3/uL (ref 4.0–10.5)

## 2017-12-19 LAB — HEPATIC FUNCTION PANEL
ALK PHOS: 53 U/L (ref 39–117)
ALT: 24 U/L (ref 0–53)
AST: 20 U/L (ref 0–37)
Albumin: 4.3 g/dL (ref 3.5–5.2)
BILIRUBIN DIRECT: 0.1 mg/dL (ref 0.0–0.3)
TOTAL PROTEIN: 6.9 g/dL (ref 6.0–8.3)
Total Bilirubin: 0.6 mg/dL (ref 0.2–1.2)

## 2017-12-19 LAB — BASIC METABOLIC PANEL
BUN: 12 mg/dL (ref 6–23)
CHLORIDE: 104 meq/L (ref 96–112)
CO2: 28 meq/L (ref 19–32)
CREATININE: 1.07 mg/dL (ref 0.40–1.50)
Calcium: 9.3 mg/dL (ref 8.4–10.5)
GFR: 92.74 mL/min (ref >60.00)
GLUCOSE: 98 mg/dL (ref 70–99)
POTASSIUM: 4.4 meq/L (ref 3.5–5.1)
Sodium: 136 mEq/L (ref 135–145)

## 2017-12-19 LAB — HEMOGLOBIN A1C: HEMOGLOBIN A1C: 5.8 % (ref 4.6–6.5)

## 2017-12-19 LAB — TSH: TSH: 0.66 u[IU]/mL (ref 0.35–4.50)

## 2017-12-19 MED ORDER — ZOSTER VAC RECOMB ADJUVANTED 50 MCG/0.5ML IM SUSR: 0.5000 mL | Freq: Once | INTRAMUSCULAR | 1 refills | Status: AC

## 2017-12-19 MED ORDER — ATORVASTATIN CALCIUM 20 MG PO TABS: 20.0000 mg | ORAL_TABLET | Freq: Every day | ORAL | 3 refills | Status: DC

## 2017-12-19 MED ORDER — BUDESONIDE-FORMOTEROL FUMARATE 160-4.5 MCG/ACT IN AERO: 2.0000 | INHALATION_SPRAY | Freq: Two times a day (BID) | RESPIRATORY_TRACT | 3 refills | Status: DC

## 2017-12-19 NOTE — Progress Notes (Signed)
Subjective:    Patient ID: Edward Reid, male    DOB: 17-Jan-1966, 52 y.o.   MRN: [id number]  HPI  Here for wellness and f/u;  Overall doing ok;  Pt denies Chest pain, worsening SOB, DOE, wheezing, orthopnea, PND, worsening LE edema, palpitations, dizziness or syncope.  Pt denies neurological change such as new headache, facial or extremity weakness.  Pt denies polydipsia, polyuria, or low sugar symptoms. Pt states overall good compliance with treatment and medications, good tolerability, and has been trying to follow appropriate diet.  Pt denies worsening depressive symptoms, suicidal ideation or panic. No fever, night sweats, wt loss, loss of appetite, or other constitutional symptoms.  Pt states good ability with ADL's, has low fall risk, home safety reviewed and adequate, no other significant changes in hearing or vision, and only occasionally active with exercise.  Left shoulder pain improved with PT for bursitis.  Does have several wks ongoing mild nasal allergy symptoms with clearish congestion, itch and sneezing, without fever, pain, ST, cough, swelling or wheezing. No other interval hx or new complaint Past Medical History:  Diagnosis Date  . ALLERGIC RHINITIS   . Erectile dysfunction   . FASCIITIS, PLANTAR   . HYPERCHOLESTEROLEMIA   . HYPERLIPIDEMIA   . HYPERTENSION   . INTERNAL HEMORRHOIDS    Past Surgical History:  Procedure Laterality Date  . FACIAL FRACTURE SURGERY    . Mass excision from Left forearm     lipoma    reports that he has never smoked. He has never used smokeless tobacco. He reports that he drinks about 1.2 oz of alcohol per week. He reports that he does not use drugs. family history includes Coronary artery disease in his father and mother; Diabetes in his mother; Lung cancer in his father; Prostate cancer in his father. Allergies  Allergen Reactions  . Codeine Other (See Comments)    headache   Current Outpatient Medications on File Prior to Visit    Medication Sig Dispense Refill  . aspirin EC 81 MG tablet Take 1 tablet (81 mg total) by mouth daily. 90 tablet 11  . nitroGLYCERIN (NITRODUR - DOSED IN MG/24 HR) 0.2 mg/hr patch     . NON FORMULARY Mucous relief pe     No current facility-administered medications on file prior to visit.    Review of Systems  Constitutional: Negative for other unusual diaphoresis, sweats, appetite or weight changes HENT: Negative for other worsening hearing loss, ear pain, facial swelling, mouth sores or neck stiffness.   Eyes: Negative for other worsening pain, redness or other visual disturbance.  Respiratory: Negative for other stridor or swelling Cardiovascular: Negative for other palpitations or other chest pain  Gastrointestinal: Negative for worsening diarrhea or loose stools, blood in stool, distention or other pain Genitourinary: Negative for hematuria, flank pain or other change in urine volume.  Musculoskeletal: Negative for myalgias or other joint swelling.  Skin: Negative for other color change, or other wound or worsening drainage.  Neurological: Negative for other syncope or numbness. Hematological: Negative for other adenopathy or swelling Psychiatric/Behavioral: Negative for hallucinations, other worsening agitation, SI, self-injury, or new decreased concentration]\All other system neg per pt  Objective:   Physical Exam BP 122/84   Pulse 81   Temp 97.6 F (36.4 C) (Oral)   Ht 6' 5.5" (1.969 m)   Wt 256 lb (116.1 kg)   SpO2 97%   BMI 29.97 kg/m  VS noted,  Constitutional: Pt is oriented to person, place, and time.  Appears well-developed and well-nourished, in no significant distress and comfortable Head: Normocephalic and atraumatic  Eyes: Conjunctivae and EOM are normal. Pupils are equal, round, and reactive to light Bilat tm's with mild erythema.  Max sinus areas non tender.  Pharynx with mild erythema, no exudate Right Ear: External ear normal without discharge Left Ear:  External ear normal without discharge Nose: Nose without discharge or deformity Mouth/Throat: Oropharynx is without other ulcerations and moist  Neck: Normal range of motion. Neck supple. No JVD present. No tracheal deviation present or significant neck LA or mass Cardiovascular: Normal rate, regular rhythm, normal heart sounds and intact distal pulses.   Pulmonary/Chest: WOB normal and breath sounds without rales or wheezing  Abdominal: Soft. Bowel sounds are normal. NT. No HSM  Musculoskeletal: Normal range of motion. Exhibits no edema Lymphadenopathy: Has no other cervical adenopathy.  Neurological: Pt is alert and oriented to person, place, and time. Pt has normal reflexes. No cranial nerve deficit. Motor grossly intact, Gait intact Skin: Skin is warm and dry. No rash noted or new ulcerations Psychiatric:  Has normal mood and affect. Behavior is normal without agitation No other exam findings    Assessment & Plan:

## 2017-12-19 NOTE — Patient Instructions (Addendum)
Your shingles shot was sent to the pharmacy  Please continue all other medications as before, and refills have been done if requested.  Please have the pharmacy call with any other refills you may need.  Please continue your efforts at being more active, low cholesterol diet, and weight control.  You are otherwise up to date with prevention measures today.  Please keep your appointments with your specialists as you may have planned  Please go to the LAB in the Basement (turn left off the elevator) for the tests to be done today  You will be contacted by phone if any changes need to be made immediately.  Otherwise, you will receive a letter about your results with an explanation, but please check with MyChart first.  Please remember to sign up for MyChart if you have not done so, as this will be important to you in the future with finding out test results, communicating by private email, and scheduling acute appointments online when needed.  Please return in 1 year for your yearly visit, or sooner if needed, with Lab testing done 3-5 days before  

## 2017-12-20 NOTE — Assessment & Plan Note (Signed)

## 2017-12-20 NOTE — Assessment & Plan Note (Signed)
stable overall by history and exam, recent data reviewed with pt, and pt to continue medical treatment as before,  to f/u any worsening symptoms or concerns Lab Results  Component Value Date   HGBA1C 5.8 12/19/2017

## 2017-12-20 NOTE — Assessment & Plan Note (Signed)
Consider increased lipitor but pt now wanting for now

## 2018-02-12 ENCOUNTER — Other Ambulatory Visit: Payer: Self-pay | Admitting: Internal Medicine

## 2018-04-24 IMAGING — MR MR SHOULDER*L* W/ CM
6 series · 40 of 40 positions shown · IV contrast (agent unspecified)
Comparison: None.

CLINICAL DATA: Lifting injury.  Arm weakness.

EXAM:
MR ARTHROGRAM OF THE LEFT SHOULDER
TECHNIQUE: Multiplanar, multisequence MR imaging of the left shoulder was
performed following the administration of intra-articular contrast.
CONTRAST:  See Injection Documentation.

[Series 3: T1 fat-sat · axial · 4.0mm · 0.51mm/px · z∈[-29,+68]mm · 8 of 23 slices shown (1 of 4)]
[im 1/23]
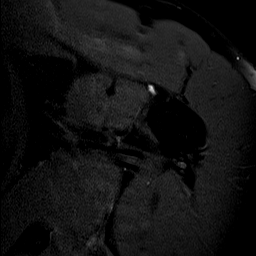
[im 4/23]
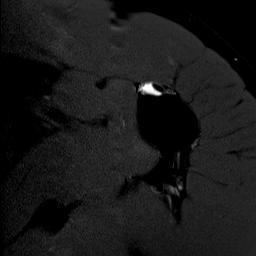
[im 7/23]
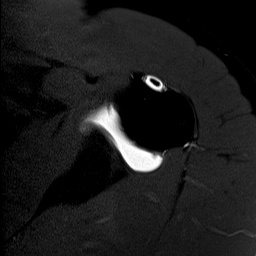
[im 10/23]
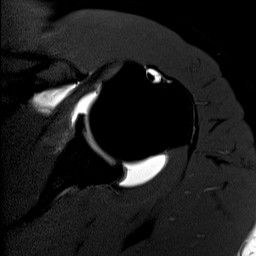
[im 13/23]
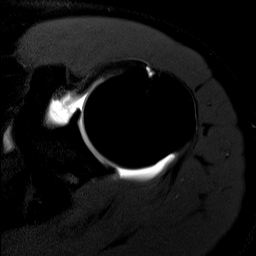
[im 16/23]
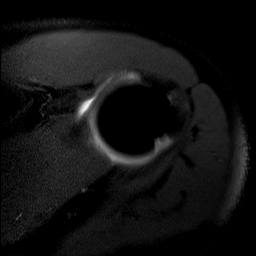
[im 19/23]
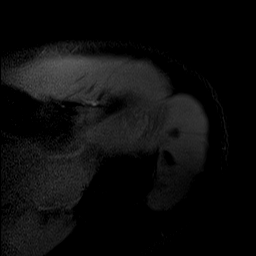
[im 23/23]
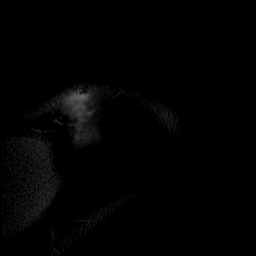

[Series 4: T1 fat-sat · coronal · 4.0mm · 0.59mm/px · 7 of 20 slices shown (2 of 4)]
[im 1/20]
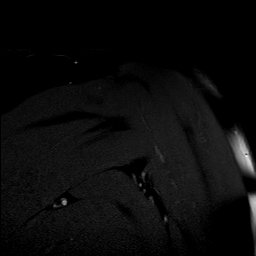
[im 4/20]
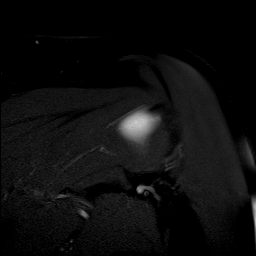
[im 7/20]
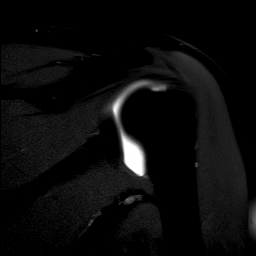
[im 10/20]
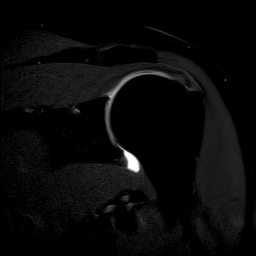
[im 13/20]
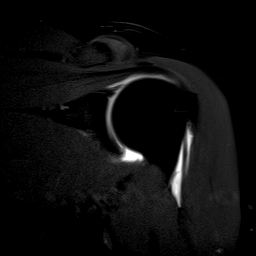
[im 16/20]
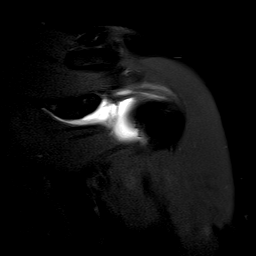
[im 20/20]
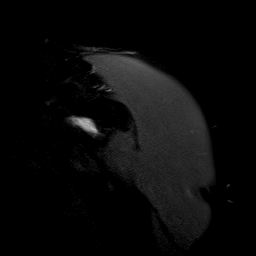

[Series 5: T2 fat-sat · coronal · 4.0mm · 0.59mm/px · 6 of 20 slices shown (1 of 2)]
[im 1/20]
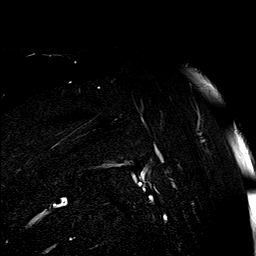
[im 4/20]
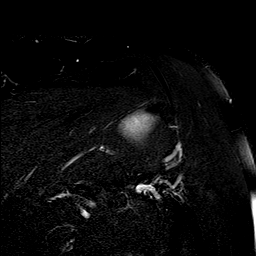
[im 8/20]
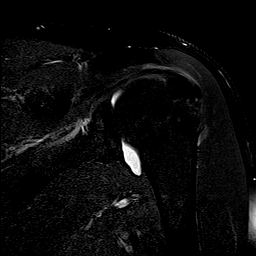
[im 12/20]
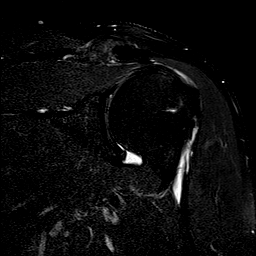
[im 16/20]
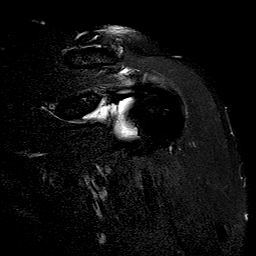
[im 20/20]
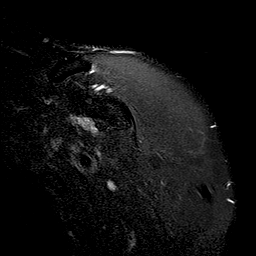

[Series 6: T1 fat-sat · coronal · non-contrast · 4.0mm · 0.59mm/px · 6 of 20 slices shown (3 of 4)]
[im 1/20]
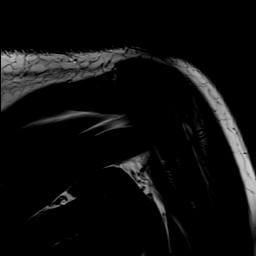
[im 4/20]
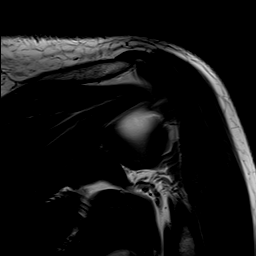
[im 8/20]
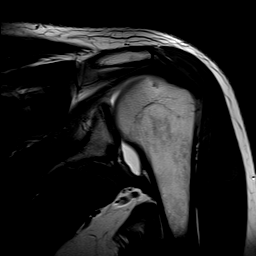
[im 12/20]
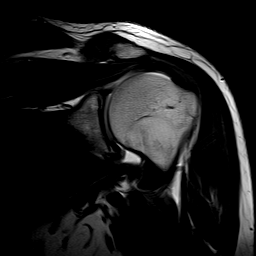
[im 16/20]
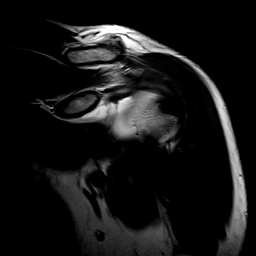
[im 20/20]
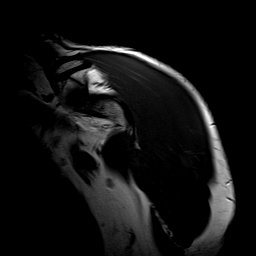

[Series 7: T2 fat-sat · sagittal · 4.0mm · 0.59mm/px · 8 of 25 slices shown (2 of 2)]
[im 1/25]
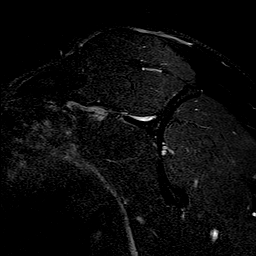
[im 4/25]
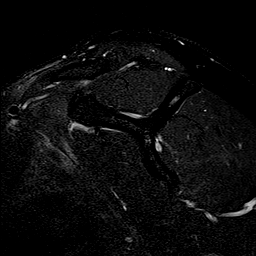
[im 7/25]
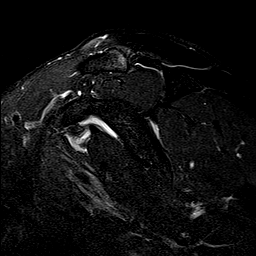
[im 11/25]
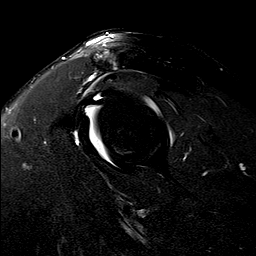
[im 14/25]
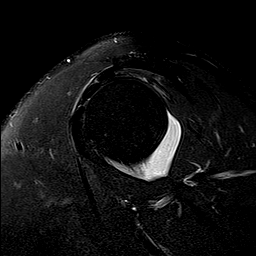
[im 18/25]
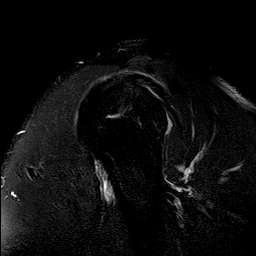
[im 21/25]
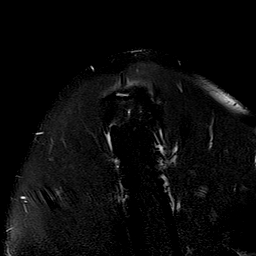
[im 25/25]
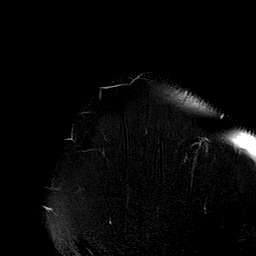

[Series 10: T1 fat-sat · sagittal · 4.0mm · 0.59mm/px · 5 of 17 slices shown (4 of 4)]
[im 1/17]
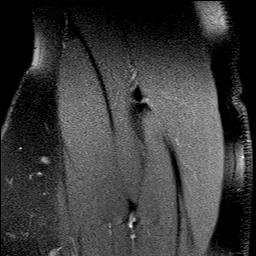
[im 5/17]
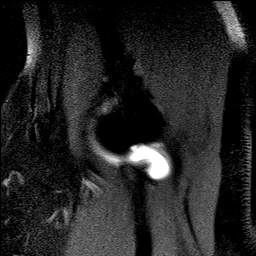
[im 9/17]
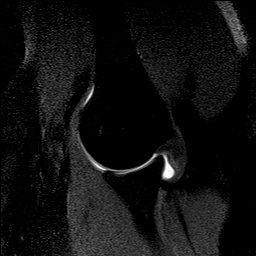
[im 13/17]
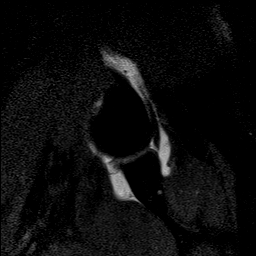
[im 17/17]
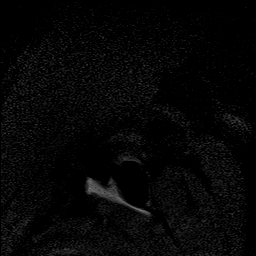

[40 of 40 positions shown; findings below may reference images not displayed]

FINDINGS: Rotator cuff: Severe tendinosis of the supraspinatus tendon with a
insertional interstitial tear and fraying along the bursal surface.
Mild tendinosis of the infraspinatus tendon without a discrete tear.
Teres minor tendon is intact. Subscapularis tendon is intact.

Muscles: No atrophy or fatty replacement of nor abnormal signal
within, the muscles of the rotator cuff.

Biceps long head: Intact.

Acromioclavicular Joint: Moderate arthropathy of the
acromioclavicular joint. Type 1 acromion. No subacromial/ subdeltoid
bursal fluid.

Glenohumeral Joint: Intraarticular contrast distending the joint
capsule. No chondral defect. Normal glenohumeral ligaments.

Labrum: Intact.

Bones: No acute osseous abnormality.
IMPRESSION: 1. Severe tendinosis of the supraspinatus tendon with a insertional
interstitial tear and fraying along the bursal surface.
2. Mild tendinosis of the infraspinatus tendon without a discrete
tear.

## 2018-12-19 ENCOUNTER — Other Ambulatory Visit: Payer: Self-pay | Admitting: Internal Medicine

## 2018-12-24 ENCOUNTER — Encounter: Payer: BLUE CROSS/BLUE SHIELD | Admitting: Internal Medicine

## 2019-03-25 ENCOUNTER — Other Ambulatory Visit (INDEPENDENT_AMBULATORY_CARE_PROVIDER_SITE_OTHER): Payer: Self-pay

## 2019-03-25 ENCOUNTER — Other Ambulatory Visit: Payer: Self-pay | Admitting: Internal Medicine

## 2019-03-25 ENCOUNTER — Ambulatory Visit (INDEPENDENT_AMBULATORY_CARE_PROVIDER_SITE_OTHER): Payer: Self-pay | Admitting: Internal Medicine

## 2019-03-25 ENCOUNTER — Encounter: Payer: Self-pay | Admitting: Internal Medicine

## 2019-03-25 ENCOUNTER — Other Ambulatory Visit: Payer: Self-pay

## 2019-03-25 VITALS — BP 134/78 | HR 83 | Temp 98.0°F | Ht 77.5 in | Wt 250.0 lb

## 2019-03-25 DIAGNOSIS — R739 Hyperglycemia, unspecified: Secondary | ICD-10-CM

## 2019-03-25 DIAGNOSIS — E611 Iron deficiency: Secondary | ICD-10-CM

## 2019-03-25 DIAGNOSIS — E559 Vitamin D deficiency, unspecified: Secondary | ICD-10-CM

## 2019-03-25 DIAGNOSIS — E538 Deficiency of other specified B group vitamins: Secondary | ICD-10-CM

## 2019-03-25 DIAGNOSIS — Z0001 Encounter for general adult medical examination with abnormal findings: Secondary | ICD-10-CM

## 2019-03-25 DIAGNOSIS — I10 Essential (primary) hypertension: Secondary | ICD-10-CM

## 2019-03-25 DIAGNOSIS — L989 Disorder of the skin and subcutaneous tissue, unspecified: Secondary | ICD-10-CM

## 2019-03-25 DIAGNOSIS — E7800 Pure hypercholesterolemia, unspecified: Secondary | ICD-10-CM

## 2019-03-25 LAB — CBC WITH DIFFERENTIAL/PLATELET
Basophils Absolute: 0 10*3/uL (ref 0.0–0.1)
Basophils Relative: 0.4 % (ref 0.0–3.0)
Eosinophils Absolute: 0.1 10*3/uL (ref 0.0–0.7)
Eosinophils Relative: 2.3 % (ref 0.0–5.0)
HCT: 43.9 % (ref 39.0–52.0)
Hemoglobin: 14.5 g/dL (ref 13.0–17.0)
Lymphocytes Relative: 41.5 % (ref 12.0–46.0)
Lymphs Abs: 2.2 10*3/uL (ref 0.7–4.0)
MCHC: 33.1 g/dL (ref 30.0–36.0)
MCV: 83.6 fl (ref 78.0–100.0)
Monocytes Absolute: 0.6 10*3/uL (ref 0.1–1.0)
Monocytes Relative: 11.6 % (ref 3.0–12.0)
Neutro Abs: 2.4 10*3/uL (ref 1.4–7.7)
Neutrophils Relative %: 44.2 % (ref 43.0–77.0)
Platelets: 210 10*3/uL (ref 150.0–400.0)
RBC: 5.26 Mil/uL (ref 4.22–5.81)
RDW: 14.3 % (ref 11.5–15.5)
WBC: 5.4 10*3/uL (ref 4.0–10.5)

## 2019-03-25 LAB — BASIC METABOLIC PANEL
BUN: 14 mg/dL (ref 6–23)
CO2: 24 mEq/L (ref 19–32)
Calcium: 9 mg/dL (ref 8.4–10.5)
Chloride: 105 mEq/L (ref 96–112)
Creatinine, Ser: 1.08 mg/dL (ref 0.40–1.50)
GFR: 85.92 mL/min (ref >60.00)
Glucose, Bld: 98 mg/dL (ref 70–99)
Potassium: 4.1 mEq/L (ref 3.5–5.1)
Sodium: 137 mEq/L (ref 135–145)

## 2019-03-25 LAB — LIPID PANEL
Cholesterol: 207 mg/dL — ABNORMAL HIGH (ref 0–200)
HDL: 36.3 mg/dL — ABNORMAL LOW (ref >39.00)
LDL Cholesterol: 140 mg/dL — ABNORMAL HIGH (ref 0–99)
NonHDL: 170.48
Total CHOL/HDL Ratio: 6
Triglycerides: 154 mg/dL — ABNORMAL HIGH (ref 0.0–149.0)
VLDL: 30.8 mg/dL (ref 0.0–40.0)

## 2019-03-25 LAB — IBC PANEL
Iron: 74 ug/dL (ref 42–165)
Saturation Ratios: 25.3 % (ref 20.0–50.0)
Transferrin: 209 mg/dL — ABNORMAL LOW (ref 212.0–360.0)

## 2019-03-25 LAB — URINALYSIS, ROUTINE W REFLEX MICROSCOPIC
Bilirubin Urine: NEGATIVE
Hgb urine dipstick: NEGATIVE
Ketones, ur: NEGATIVE
Leukocytes,Ua: NEGATIVE
Nitrite: NEGATIVE
RBC / HPF: NONE SEEN (ref 0–?)
Specific Gravity, Urine: >=1.03 — AB (ref 1.000–1.030)
Total Protein, Urine: NEGATIVE
Urine Glucose: NEGATIVE
Urobilinogen, UA: 0.2 (ref 0.0–1.0)
pH: 5.5 (ref 5.0–8.0)

## 2019-03-25 LAB — PSA: PSA: 1.32 ng/mL (ref 0.10–4.00)

## 2019-03-25 LAB — HEPATIC FUNCTION PANEL
ALT: 21 U/L (ref 0–53)
AST: 17 U/L (ref 0–37)
Albumin: 4.2 g/dL (ref 3.5–5.2)
Alkaline Phosphatase: 52 U/L (ref 39–117)
Bilirubin, Direct: 0.1 mg/dL (ref 0.0–0.3)
Total Bilirubin: 0.5 mg/dL (ref 0.2–1.2)
Total Protein: 7.3 g/dL (ref 6.0–8.3)

## 2019-03-25 LAB — TSH: TSH: 0.83 u[IU]/mL (ref 0.35–4.50)

## 2019-03-25 LAB — HEMOGLOBIN A1C: Hgb A1c MFr Bld: 5.7 % (ref 4.6–6.5)

## 2019-03-25 LAB — VITAMIN D 25 HYDROXY (VIT D DEFICIENCY, FRACTURES): VITD: 17.75 ng/mL — ABNORMAL LOW (ref 30.00–100.00)

## 2019-03-25 LAB — VITAMIN B12: Vitamin B-12: 103 pg/mL — ABNORMAL LOW (ref 211–911)

## 2019-03-25 MED ORDER — VITAMIN D (ERGOCALCIFEROL) 1.25 MG (50000 UNIT) PO CAPS: 50000.0000 [IU] | ORAL_CAPSULE | ORAL | 0 refills | Status: DC

## 2019-03-25 NOTE — Progress Notes (Signed)
Subjective:    Patient ID: Edward Reid, male    DOB: 1965/11/22, 53 y.o.   MRN: [id number]  HPI  Here for wellness and f/u;  Overall doing ok;  Pt denies Chest pain, worsening SOB, DOE, wheezing, orthopnea, PND, worsening LE edema, palpitations, dizziness or syncope.  Pt denies neurological change such as new headache, facial or extremity weakness.  Pt denies polydipsia, polyuria, or low sugar symptoms. Pt states overall good compliance with treatment and medications, good tolerability, and has been trying to follow appropriate diet.  Pt denies worsening depressive symptoms, suicidal ideation or panic. No fever, night sweats, wt loss, loss of appetite, or other constitutional symptoms.  Pt states good ability with ADL's, has low fall risk, home safety reviewed and adequate, no other significant changes in hearing or vision, and only occasionally active with exercise  Lost 6 lbs with better diet.  Also has a subq lesion small nontender to proximal dorsal penis for several months, was larger at one point and maybe some tender, now smaller but still present, denies trauma, fever or penile d/c, rash or scrotal swelling. Wt Readings from Last 3 Encounters:  03/25/19 250 lb (113.4 kg)  12/19/17 256 lb (116.1 kg)  06/21/17 256 lb 12.8 oz (116.5 kg)   Past Medical History:  Diagnosis Date  . ALLERGIC RHINITIS   . Erectile dysfunction   . FASCIITIS, PLANTAR   . HYPERCHOLESTEROLEMIA   . HYPERLIPIDEMIA   . HYPERTENSION   . INTERNAL HEMORRHOIDS    Past Surgical History:  Procedure Laterality Date  . FACIAL FRACTURE SURGERY    . Mass excision from Left forearm     lipoma    reports that he has never smoked. He has never used smokeless tobacco. He reports current alcohol use of about 2.0 standard drinks of alcohol per week. He reports that he does not use drugs. family history includes Coronary artery disease in his father and mother; Diabetes in his mother; Lung cancer in his father; Prostate  cancer in his father. Allergies  Allergen Reactions  . Codeine Other (See Comments)    headache   Current Outpatient Medications on File Prior to Visit  Medication Sig Dispense Refill  . aspirin EC 81 MG tablet Take 1 tablet (81 mg total) by mouth daily. 90 tablet 11  . atorvastatin (LIPITOR) 10 MG tablet TAKE 1 TABLET (10 MG TOTAL) BY MOUTH DAILY. 90 tablet 2  . atorvastatin (LIPITOR) 20 MG tablet Take 1 tablet (20 mg total) by mouth daily. Annual appt is due must see provider for future refills 30 tablet 0  . budesonide-formoterol (SYMBICORT) 160-4.5 MCG/ACT inhaler Inhale 2 puffs into the lungs 2 (two) times daily. 3 Inhaler 3  . nitroGLYCERIN (NITRODUR - DOSED IN MG/24 HR) 0.2 mg/hr patch     . NON FORMULARY Mucous relief pe     No current facility-administered medications on file prior to visit.    Review of Systems Constitutional: Negative for other unusual diaphoresis, sweats, appetite or weight changes HENT: Negative for other worsening hearing loss, ear pain, facial swelling, mouth sores or neck stiffness.   Eyes: Negative for other worsening pain, redness or other visual disturbance.  Respiratory: Negative for other stridor or swelling Cardiovascular: Negative for other palpitations or other chest pain  Gastrointestinal: Negative for worsening diarrhea or loose stools, blood in stool, distention or other pain Genitourinary: Negative for hematuria, flank pain or other change in urine volume.  Musculoskeletal: Negative for myalgias or other joint swelling.  Skin: Negative for other color change, or other wound or worsening drainage.  Neurological: Negative for other syncope or numbness. Hematological: Negative for other adenopathy or swelling Psychiatric/Behavioral: Negative for hallucinations, other worsening agitation, SI, self-injury, or new decreased concentration All other system neg per pt    Objective:   Physical Exam BP 134/78   Pulse 83   Temp 98 F (36.7 C)  (Oral)   Ht 6' 5.5" (1.969 m)   Wt 250 lb (113.4 kg)   SpO2 95%   BMI 29.26 kg/m  VS noted,  Constitutional: Pt is oriented to person, place, and time. Appears well-developed and well-nourished, in no significant distress and comfortable Head: Normocephalic and atraumatic  Eyes: Conjunctivae and EOM are normal. Pupils are equal, round, and reactive to light Right Ear: External ear normal without discharge Left Ear: External ear normal without discharge Nose: Nose without discharge or deformity Mouth/Throat: Oropharynx is without other ulcerations and moist  Neck: Normal range of motion. Neck supple. No JVD present. No tracheal deviation present or significant neck LA or mass Cardiovascular: Normal rate, regular rhythm, normal heart sounds and intact distal pulses.   Pulmonary/Chest: WOB normal and breath sounds without rales or wheezing  Abdominal: Soft. Bowel sounds are normal. NT. No HSM  Musculoskeletal: Normal range of motion. Exhibits no edema Lymphadenopathy: Has no other cervical adenopathy. Penis with 10 mm subq nodular nontender mass prox dorsal penis without overlying skin change or rash  Neurological: Pt is alert and oriented to person, place, and time. Pt has normal reflexes. No cranial nerve deficit. Motor grossly intact, Gait intact Skin: Skin is warm and dry. No rash noted or new ulcerations Psychiatric:  Has normal mood and affect. Behavior is normal without agitation No other exam findings Lab Results  Component Value Date   WBC 5.4 03/25/2019   HGB 14.5 03/25/2019   HCT 43.9 03/25/2019   PLT 210.0 03/25/2019   GLUCOSE 98 03/25/2019   CHOL 207 (H) 03/25/2019   TRIG 154.0 (H) 03/25/2019   HDL 36.30 (L) 03/25/2019   LDLDIRECT 135.0 12/12/2016   LDLCALC 140 (H) 03/25/2019   ALT 21 03/25/2019   AST 17 03/25/2019   NA 137 03/25/2019   K 4.1 03/25/2019   CL 105 03/25/2019   CREATININE 1.08 03/25/2019   BUN 14 03/25/2019   CO2 24 03/25/2019   TSH 0.83 03/25/2019    PSA 1.32 03/25/2019   HGBA1C 5.7 03/25/2019      Assessment & Plan:

## 2019-03-25 NOTE — Patient Instructions (Signed)
The spot you mentioned appears to be benign and consistent with a cyst or nodule that does not require specific treatment.  Please return if changes or gets larger and larger, but otherwise this can be followed.  Please continue all other medications as before, and refills have been done if requested.  Please have the pharmacy call with any other refills you may need.  Please continue your efforts at being more active, low cholesterol diet, and weight control.  You are otherwise up to date with prevention measures today.  Please keep your appointments with your specialists as you may have planned  Please go to the LAB in the Basement (turn left off the elevator) for the tests to be done today  You will be contacted by phone if any changes need to be made immediately.  Otherwise, you will receive a letter about your results with an explanation, but please check with MyChart first.  Please remember to sign up for MyChart if you have not done so, as this will be important to you in the future with finding out test results, communicating by private email, and scheduling acute appointments online when needed.  Please return in 1 year for your yearly visit, or sooner if needed, with Lab testing done 3-5 days before

## 2019-03-25 NOTE — Assessment & Plan Note (Signed)
stable overall by history and exam, recent data reviewed with pt, and pt to continue medical treatment as before,  to f/u any worsening symptoms or concerns  

## 2019-03-25 NOTE — Assessment & Plan Note (Signed)
For lower chol diet, check lipids

## 2019-03-25 NOTE — Assessment & Plan Note (Addendum)
Benign apearing likely cystic subq mass or nodule, does not appear to need further evaluation or tx, consider urology or derm if worsens  In addition to the time spent performing CPE, I spent an additional 25 minutes face to face,in which greater than 50% of this time was spent in counseling and coordination of care for patient's acute illness as documented, including the differential dx, treatment, further evaluation and other management of skin lesion, HTn, HLD, Hyperglycemia

## 2019-03-25 NOTE — Assessment & Plan Note (Signed)

## 2019-03-26 ENCOUNTER — Telehealth: Payer: Self-pay

## 2019-03-26 NOTE — Telephone Encounter (Signed)
Called pt, LVM.   CRM created.  

## 2019-03-26 NOTE — Telephone Encounter (Signed)
-----   Message from Biagio Borg, MD sent at 03/25/2019 12:38 PM EDT ----- Left message on MyChart, pt to cont same tx except  The test results show that your current treatment is OK, except the LDL cholesterol is moderately elevated, the Vitamin D level is low, and the Vitamin B12 level is low  We need to: 1)  Make sure to take your statin every day and follow a lower cholesterol diet 2)  Please take Vitamin D 50000 units weekly for 12 weeks, then plan to change to OTC Vitamin D3 at 2000 units per day, indefinitely. 3)  Start monthly B12 shots at the office (you should be called about this) until your next visit, when we may be able to change to pills    Edward Reid to please inform pt, I will do rx, and help pt start B12 shots

## 2019-06-05 ENCOUNTER — Other Ambulatory Visit: Payer: Self-pay | Admitting: Internal Medicine

## 2019-06-26 ENCOUNTER — Other Ambulatory Visit: Payer: Self-pay | Admitting: Internal Medicine

## 2019-07-13 ENCOUNTER — Telehealth: Payer: Self-pay | Admitting: Internal Medicine

## 2019-07-13 NOTE — Telephone Encounter (Signed)
Pt called in to ask, pt says that he is taking Vitamin D, Ergocalciferol, (DRISDOL) 1.25 MG (50000 UT) CAPS capsule but is not real sure about why? Pt would like to discuss further. Also if pt is to continue, he would need a refill   Pharmacy:  [id number] - [place], [place] [phone] (Phone) [phone] (Fax)

## 2019-07-13 NOTE — Telephone Encounter (Signed)
Pt has been informed why he was taking medication but can now take OTW vitamin D once completing script. No refill needed.

## 2019-07-14 ENCOUNTER — Other Ambulatory Visit: Payer: Self-pay

## 2019-07-14 DIAGNOSIS — Z20822 Contact with and (suspected) exposure to covid-19: Secondary | ICD-10-CM

## 2019-07-16 LAB — NOVEL CORONAVIRUS, NAA: SARS-CoV-2, NAA: NOT DETECTED

## 2019-09-15 ENCOUNTER — Other Ambulatory Visit: Payer: Self-pay | Admitting: Internal Medicine

## 2019-09-15 NOTE — Telephone Encounter (Signed)
Please refill as per office routine med refill policy (all routine meds refilled for 3 mo or monthly per pt preference up to one year from last visit, then month to month grace period for 3 mo, then further med refills will have to be denied)  

## 2019-09-16 ENCOUNTER — Other Ambulatory Visit: Payer: Self-pay | Admitting: Internal Medicine

## 2019-11-26 ENCOUNTER — Ambulatory Visit: Payer: Self-pay | Attending: Internal Medicine

## 2019-11-26 DIAGNOSIS — Z23 Encounter for immunization: Secondary | ICD-10-CM

## 2019-11-26 NOTE — Progress Notes (Signed)
   Covid-19 Vaccination Clinic  Name:  Edward Reid    MRN: [id number] DOB: 06/29/1966  11/26/2019  [name] was observed post Covid-19 immunization for 15 minutes without incident. He was provided with Vaccine Information Sheet and instruction to access the V-Safe system.   [name] was instructed to call 911 with any severe reactions post vaccine: Marland Kitchen Difficulty breathing  . Swelling of face and throat  . A fast heartbeat  . A bad rash all over body  . Dizziness and weakness   Immunizations Administered    Name Date Dose VIS Date Route   Pfizer COVID-19 Vaccine 11/26/2019  3:04 PM 0.3 mL 08/14/2019 Intramuscular   Manufacturer: [employer], [employer]   Lot: [id number]   NDC: [address]-9

## 2019-12-21 ENCOUNTER — Ambulatory Visit: Payer: Self-pay | Attending: Internal Medicine

## 2019-12-21 DIAGNOSIS — Z23 Encounter for immunization: Secondary | ICD-10-CM

## 2019-12-21 NOTE — Progress Notes (Signed)
   Covid-19 Vaccination Clinic  Name:  TIRRELL BUCHBERGER    MRN: [id number] DOB: 1966/05/07  12/21/2019  Mr. Dennard was observed post Covid-19 immunization for 15 minutes without incident. He was provided with Vaccine Information Sheet and instruction to access the V-Safe system.   Mr. Quesinberry was instructed to call 911 with any severe reactions post vaccine: Marland Kitchen Difficulty breathing  . Swelling of face and throat  . A fast heartbeat  . A bad rash all over body  . Dizziness and weakness   Immunizations Administered    Name Date Dose VIS Date Route   Pfizer COVID-19 Vaccine 12/21/2019 12:43 PM 0.3 mL 10/28/2018 Intramuscular   Manufacturer: [employer], [employer]   Lot: [id number]   NDC: [address]-2

## 2020-03-30 ENCOUNTER — Other Ambulatory Visit: Payer: Self-pay

## 2020-03-30 ENCOUNTER — Ambulatory Visit (INDEPENDENT_AMBULATORY_CARE_PROVIDER_SITE_OTHER): Payer: BC Managed Care – PPO | Admitting: Internal Medicine

## 2020-03-30 ENCOUNTER — Encounter: Payer: Self-pay | Admitting: Internal Medicine

## 2020-03-30 VITALS — BP 130/80 | HR 75 | Temp 98.4°F | Ht 77.5 in | Wt 253.0 lb

## 2020-03-30 DIAGNOSIS — J309 Allergic rhinitis, unspecified: Secondary | ICD-10-CM

## 2020-03-30 DIAGNOSIS — R42 Dizziness and giddiness: Secondary | ICD-10-CM

## 2020-03-30 DIAGNOSIS — E559 Vitamin D deficiency, unspecified: Secondary | ICD-10-CM | POA: Diagnosis not present

## 2020-03-30 DIAGNOSIS — N529 Male erectile dysfunction, unspecified: Secondary | ICD-10-CM

## 2020-03-30 DIAGNOSIS — E7800 Pure hypercholesterolemia, unspecified: Secondary | ICD-10-CM

## 2020-03-30 DIAGNOSIS — E538 Deficiency of other specified B group vitamins: Secondary | ICD-10-CM | POA: Diagnosis not present

## 2020-03-30 DIAGNOSIS — R739 Hyperglycemia, unspecified: Secondary | ICD-10-CM

## 2020-03-30 DIAGNOSIS — I10 Essential (primary) hypertension: Secondary | ICD-10-CM

## 2020-03-30 DIAGNOSIS — Z0000 Encounter for general adult medical examination without abnormal findings: Secondary | ICD-10-CM | POA: Diagnosis not present

## 2020-03-30 DIAGNOSIS — Z0001 Encounter for general adult medical examination with abnormal findings: Secondary | ICD-10-CM

## 2020-03-30 MED ORDER — ATORVASTATIN CALCIUM 20 MG PO TABS: 20.0000 mg | ORAL_TABLET | Freq: Every day | ORAL | 3 refills | Status: DC

## 2020-03-30 MED ORDER — MECLIZINE HCL 12.5 MG PO TABS: 12.5000 mg | ORAL_TABLET | Freq: Three times a day (TID) | ORAL | 3 refills | Status: DC | PRN

## 2020-03-30 MED ORDER — BUDESONIDE-FORMOTEROL FUMARATE 160-4.5 MCG/ACT IN AERO: INHALATION_SPRAY | RESPIRATORY_TRACT | 3 refills | Status: DC

## 2020-03-30 MED ORDER — SILDENAFIL CITRATE 100 MG PO TABS: 50.0000 mg | ORAL_TABLET | Freq: Every day | ORAL | 11 refills | Status: DC | PRN

## 2020-03-30 NOTE — Progress Notes (Signed)
Subjective:    Patient ID: Edward Reid, male    DOB: 1966-08-09, 54 y.o.   MRN: [id number]  HPI  Here for wellness and f/u;  Overall doing ok;  Pt denies Chest pain, worsening SOB, DOE, wheezing, orthopnea, PND, worsening LE edema, palpitations, or syncope.  Pt denies neurological change such as new headache, facial or extremity weakness.  Pt denies polydipsia, polyuria, or low sugar symptoms. Pt states overall good compliance with treatment and medications, good tolerability, and has been trying to follow appropriate diet.  Pt denies worsening depressive symptoms, suicidal ideation or panic. No fever, night sweats, wt loss, loss of appetite, or other constitutional symptoms.  Pt states good ability with ADL's, has low fall risk, home safety reviewed and adequate, no other significant changes in hearing or vision, and only occasionally active with exercise. Has some vertigo with lying down or getting up too fast and head changes position.   Also with worsening ED symptoms, needs tx.  Also .Does have several wks ongoing nasal allergy symptoms with clearish congestion, itch and sneezing, without fever, pain, ST, cough, swelling or wheezing. Past Medical History:  Diagnosis Date  . ALLERGIC RHINITIS   . Erectile dysfunction   . FASCIITIS, PLANTAR   . HYPERCHOLESTEROLEMIA   . HYPERLIPIDEMIA   . HYPERTENSION   . INTERNAL HEMORRHOIDS    Past Surgical History:  Procedure Laterality Date  . FACIAL FRACTURE SURGERY    . Mass excision from Left forearm     lipoma    reports that he has never smoked. He has never used smokeless tobacco. He reports current alcohol use of about 2.0 standard drinks of alcohol per week. He reports that he does not use drugs. family history includes Coronary artery disease in his father and mother; Diabetes in his mother; Lung cancer in his father; Prostate cancer in his father. Allergies  Allergen Reactions  . Codeine Other (See Comments)    headache   Current  Outpatient Medications on File Prior to Visit  Medication Sig Dispense Refill  . aspirin EC 81 MG tablet Take 1 tablet (81 mg total) by mouth daily. 90 tablet 11  . NON FORMULARY Mucous relief pe     No current facility-administered medications on file prior to visit.   Review of Systems All otherwise neg per pt    Objective:   Physical Exam BP (!) 130/80 (BP Location: Left Arm, Patient Position: Sitting, Cuff Size: Large)   Pulse 75   Temp 98.4 F (36.9 C) (Oral)   Ht 6' 5.5" (1.969 m)   Wt (!) 253 lb (114.8 kg)   SpO2 97%   BMI 29.62 kg/m  VS noted,  Constitutional: Pt appears in NAD HENT: Head: NCAT.  Right Ear: External ear normal.  Left Ear: External ear normal.  Eyes: . Pupils are equal, round, and reactive to light. Conjunctivae and EOM are normal Nose: without d/c or deformity Neck: Neck supple. Gross normal ROM Cardiovascular: Normal rate and regular rhythm.   Pulmonary/Chest: Effort normal and breath sounds without rales or wheezing.  Abd:  Soft, NT, ND, + BS, no organomegaly Neurological: Pt is alert. At baseline orientation, motor grossly intact Skin: Skin is warm. No rashes, other new lesions, no LE edema Psychiatric: Pt behavior is normal without agitation  All otherwise neg per pt Lab Results  Component Value Date   WBC 5.5 03/30/2020   HGB 14.8 03/30/2020   HCT 46.1 03/30/2020   PLT 209 03/30/2020   GLUCOSE  102 (H) 03/30/2020   CHOL 149 03/30/2020   TRIG 80 03/30/2020   HDL 41 03/30/2020   LDLDIRECT 135.0 12/12/2016   LDLCALC 91 03/30/2020   ALT 20 03/30/2020   AST 19 03/30/2020   NA 137 03/30/2020   K 4.2 03/30/2020   CL 105 03/30/2020   CREATININE 1.14 03/30/2020   BUN 12 03/30/2020   CO2 28 03/30/2020   TSH 0.76 03/30/2020   PSA 1.3 03/30/2020   HGBA1C 5.6 03/30/2020      Assessment & Plan:

## 2020-03-30 NOTE — Patient Instructions (Addendum)
Please take OTC Vitamin D3 at 2000 units per day, indefinitely.  Please also start OTC Vitamin B12 and take at least 3 -4 days per wk  Please take all new medication as prescribed - the viagra as needed, and the meclizine as needed for dizziness  Please continue all other medications as before, and refills have been done if requested.  Please have the pharmacy call with any other refills you may need.  Please continue your efforts at being more active, low cholesterol diet, and weight control.  You are otherwise up to date with prevention measures today.  Please keep your appointments with your specialists as you may have planned  Please go to the LAB at the blood drawing area for the tests to be done  You will be contacted by phone if any changes need to be made immediately.  Otherwise, you will receive a letter about your results with an explanation, but please check with MyChart first.  Please remember to sign up for MyChart if you have not done so, as this will be important to you in the future with finding out test results, communicating by private email, and scheduling acute appointments online when needed.  Please make an Appointment to return for your 1 year visit, or sooner if needed

## 2020-03-31 ENCOUNTER — Encounter: Payer: Self-pay | Admitting: Internal Medicine

## 2020-03-31 ENCOUNTER — Other Ambulatory Visit: Payer: Self-pay | Admitting: Internal Medicine

## 2020-03-31 LAB — URINALYSIS, ROUTINE W REFLEX MICROSCOPIC
Bilirubin Urine: NEGATIVE
Glucose, UA: NEGATIVE
Hgb urine dipstick: NEGATIVE
Ketones, ur: NEGATIVE
Leukocytes,Ua: NEGATIVE
Nitrite: NEGATIVE
Protein, ur: NEGATIVE
Specific Gravity, Urine: 1.019 (ref 1.001–1.03)
pH: <=5 (ref 5.0–8.0)

## 2020-03-31 LAB — COMPLETE METABOLIC PANEL WITH GFR
AG Ratio: 1.6 (calc) (ref 1.0–2.5)
ALT: 20 U/L (ref 9–46)
AST: 19 U/L (ref 10–35)
Albumin: 4.1 g/dL (ref 3.6–5.1)
Alkaline phosphatase (APISO): 52 U/L (ref 35–144)
BUN: 12 mg/dL (ref 7–25)
CO2: 28 mmol/L (ref 20–32)
Calcium: 9 mg/dL (ref 8.6–10.3)
Chloride: 105 mmol/L (ref 98–110)
Creat: 1.14 mg/dL (ref 0.70–1.33)
GFR, Est African American: 83 mL/min/{1.73_m2} (ref >=60)
GFR, Est Non African American: 72 mL/min/{1.73_m2} (ref >=60)
Globulin: 2.5 g/dL (calc) (ref 1.9–3.7)
Glucose, Bld: 102 mg/dL — ABNORMAL HIGH (ref 65–99)
Potassium: 4.2 mmol/L (ref 3.5–5.3)
Sodium: 137 mmol/L (ref 135–146)
Total Bilirubin: 0.5 mg/dL (ref 0.2–1.2)
Total Protein: 6.6 g/dL (ref 6.1–8.1)

## 2020-03-31 LAB — CBC WITH DIFFERENTIAL/PLATELET
Absolute Monocytes: 567 cells/uL (ref 200–950)
Basophils Absolute: 22 cells/uL (ref 0–200)
Basophils Relative: 0.4 %
Eosinophils Absolute: 110 cells/uL (ref 15–500)
Eosinophils Relative: 2 %
HCT: 46.1 % (ref 38.5–50.0)
Hemoglobin: 14.8 g/dL (ref 13.2–17.1)
Lymphs Abs: 1964 cells/uL (ref 850–3900)
MCH: 27 pg (ref 27.0–33.0)
MCHC: 32.1 g/dL (ref 32.0–36.0)
MCV: 84.1 fL (ref 80.0–100.0)
MPV: 10.2 fL (ref 7.5–12.5)
Monocytes Relative: 10.3 %
Neutro Abs: 2838 cells/uL (ref 1500–7800)
Neutrophils Relative %: 51.6 %
Platelets: 209 10*3/uL (ref 140–400)
RBC: 5.48 10*6/uL (ref 4.20–5.80)
RDW: 13.5 % (ref 11.0–15.0)
Total Lymphocyte: 35.7 %
WBC: 5.5 10*3/uL (ref 3.8–10.8)

## 2020-03-31 LAB — HEMOGLOBIN A1C
Hgb A1c MFr Bld: 5.6 % of total Hgb (ref <5.7)
Mean Plasma Glucose: 114 (calc)
eAG (mmol/L): 6.3 (calc)

## 2020-03-31 LAB — LIPID PANEL
Cholesterol: 149 mg/dL (ref <200)
HDL: 41 mg/dL (ref >=40)
LDL Cholesterol (Calc): 91 mg/dL (calc)
Non-HDL Cholesterol (Calc): 108 mg/dL (calc) (ref <130)
Total CHOL/HDL Ratio: 3.6 (calc) (ref <5.0)
Triglycerides: 80 mg/dL (ref <150)

## 2020-03-31 LAB — VITAMIN D 25 HYDROXY (VIT D DEFICIENCY, FRACTURES): Vit D, 25-Hydroxy: 17 ng/mL — ABNORMAL LOW (ref 30–100)

## 2020-03-31 LAB — VITAMIN B12: Vitamin B-12: 224 pg/mL (ref 200–1100)

## 2020-03-31 LAB — TSH: TSH: 0.76 mIU/L (ref 0.40–4.50)

## 2020-03-31 LAB — PSA: PSA: 1.3 ng/mL (ref <=4.0)

## 2020-03-31 MED ORDER — VITAMIN D (ERGOCALCIFEROL) 1.25 MG (50000 UNIT) PO CAPS: 50000.0000 [IU] | ORAL_CAPSULE | ORAL | 0 refills | Status: DC

## 2020-04-03 ENCOUNTER — Encounter: Payer: Self-pay | Admitting: Internal Medicine

## 2020-04-03 NOTE — Assessment & Plan Note (Signed)
stable overall by history and exam, recent data reviewed with pt, and pt to continue medical treatment as before,  to f/u any worsening symptoms or concerns  

## 2020-04-03 NOTE — Assessment & Plan Note (Signed)
Ok for otc antihistamine and nasacort asd,  to f/u any worsening symptoms or concerns

## 2020-04-03 NOTE — Assessment & Plan Note (Signed)

## 2020-04-03 NOTE — Assessment & Plan Note (Addendum)
C/w positional new onset - for meclizine prn  I spent 31 minutes in preparing to see the patient by review of recent labs, imaging and procedures, obtaining and reviewing separately obtained history, communicating with the patient and family or caregiver, ordering medications, tests or procedures, and documenting clinical information in the EHR including the differential Dx, treatment, and any further evaluation and other management of vertigo, hyperglycemia, hld, htn, ED and allergies

## 2020-04-03 NOTE — Assessment & Plan Note (Signed)
Recent new onset, for viagra prn

## 2020-05-08 ENCOUNTER — Other Ambulatory Visit: Payer: Self-pay | Admitting: Internal Medicine

## 2020-05-08 NOTE — Telephone Encounter (Signed)
Please tchange to OTC Vitamin D3 at 2000 units per day, indefinitely.  

## 2020-05-18 ENCOUNTER — Ambulatory Visit (HOSPITAL_COMMUNITY)
Admission: RE | Admit: 2020-05-18 | Discharge: 2020-05-18 | Disposition: A | Discharge status: Home / Self Care | Payer: BC Managed Care – PPO | Source: Ambulatory Visit | Attending: Internal Medicine | Admitting: Internal Medicine

## 2020-05-18 ENCOUNTER — Encounter: Payer: Self-pay | Admitting: Internal Medicine

## 2020-05-18 ENCOUNTER — Ambulatory Visit: Payer: BC Managed Care – PPO | Admitting: Internal Medicine

## 2020-05-18 ENCOUNTER — Other Ambulatory Visit: Payer: Self-pay

## 2020-05-18 ENCOUNTER — Telehealth: Payer: Self-pay

## 2020-05-18 VITALS — BP 150/70 | HR 74 | Temp 97.9°F | Ht 77.5 in | Wt 253.0 lb

## 2020-05-18 DIAGNOSIS — R739 Hyperglycemia, unspecified: Secondary | ICD-10-CM

## 2020-05-18 DIAGNOSIS — M79661 Pain in right lower leg: Secondary | ICD-10-CM

## 2020-05-18 DIAGNOSIS — M25561 Pain in right knee: Secondary | ICD-10-CM | POA: Diagnosis present

## 2020-05-18 DIAGNOSIS — I10 Essential (primary) hypertension: Secondary | ICD-10-CM

## 2020-05-18 DIAGNOSIS — M7989 Other specified soft tissue disorders: Secondary | ICD-10-CM | POA: Diagnosis not present

## 2020-05-18 MED ORDER — TRAMADOL HCL 50 MG PO TABS: 50.0000 mg | ORAL_TABLET | Freq: Four times a day (QID) | ORAL | 0 refills | Status: DC | PRN

## 2020-05-18 NOTE — Assessment & Plan Note (Signed)
stable overall by history and exam, recent data reviewed with pt, and pt to continue medical treatment as before,  to f/u any worsening symptoms or concerns  

## 2020-05-18 NOTE — Assessment & Plan Note (Addendum)
Most likely rupturing of a baker cyst related, now improving overall, but cant r/o dvt - for RLE venous doppler  I spent 31 minutes in preparing to see the patient by review of recent labs, imaging and procedures, obtaining and reviewing separately obtained history, communicating with the patient and family or caregiver, ordering medications, tests or procedures, and documenting clinical information in the EHR including the differential Dx, treatment, and any further evaluation and other management of right knee, leg pain and swelling, hyperglyemia, htn

## 2020-05-18 NOTE — Patient Instructions (Signed)
Please take all new medication as prescribed - the pain medication as needed (tramadol)  Please continue all other medications as before, and refills have been done if requested.  Please have the pharmacy call with any other refills you may need.  Please continue your efforts at being more active, low cholesterol diet, and weight control.  You are otherwise up to date with prevention measures today.  Please keep your appointments with your specialists as you may have planned  You will be contacted regarding the referral for: right leg vein test to make sure no blood clot  You will be contacted regarding the referral for: Sports medicine for possible bakers cyst rupture

## 2020-05-18 NOTE — Progress Notes (Signed)
Subjective:    Patient ID: Edward Reid, male    DOB: 12/18/1965, 54 y.o.   MRN: [id number]  HPI  Here with acute onset 10 days right knee/leg pain and swelling acutely suddenly worse to start with 10/10 pain, with some swelling to the knee that has slowly gotten better, but the leg swelling has been increased and persistent with post knee and calf pain and tenderness as well.  No trauma.  Walks for work most all day 6 days per wk. No fever.  No hx of DVT. Pt denies chest pain, increased sob or doe, wheezing, orthopnea, PND, increased LE swelling, palpitations, dizziness or syncope.   Pt denies polydipsia, polyuria Past Medical History:  Diagnosis Date  . ALLERGIC RHINITIS   . Erectile dysfunction   . FASCIITIS, PLANTAR   . HYPERCHOLESTEROLEMIA   . HYPERLIPIDEMIA   . HYPERTENSION   . INTERNAL HEMORRHOIDS    Past Surgical History:  Procedure Laterality Date  . FACIAL FRACTURE SURGERY    . Mass excision from Left forearm     lipoma    reports that he has never smoked. He has never used smokeless tobacco. He reports current alcohol use of about 2.0 standard drinks of alcohol per week. He reports that he does not use drugs. family history includes Coronary artery disease in his father and mother; Diabetes in his mother; Lung cancer in his father; Prostate cancer in his father. Allergies  Allergen Reactions  . Codeine Other (See Comments)    headache   Current Outpatient Medications on File Prior to Visit  Medication Sig Dispense Refill  . aspirin EC 81 MG tablet Take 1 tablet (81 mg total) by mouth daily. 90 tablet 11  . atorvastatin (LIPITOR) 20 MG tablet Take 1 tablet (20 mg total) by mouth daily. 90 tablet 3  . budesonide-formoterol (SYMBICORT) 160-4.5 MCG/ACT inhaler TAKE 2 PUFFS BY MOUTH TWICE A DAY 30.6 Inhaler 3  . meclizine (ANTIVERT) 12.5 MG tablet Take 1 tablet (12.5 mg total) by mouth 3 (three) times daily as needed for dizziness. 40 tablet 3  . NON FORMULARY Mucous relief  pe    . sildenafil (VIAGRA) 100 MG tablet Take 0.5-1 tablets (50-100 mg total) by mouth daily as needed for erectile dysfunction. 5 tablet 11  . Vitamin D, Ergocalciferol, (DRISDOL) 1.25 MG (50000 UNIT) CAPS capsule Take 1 capsule (50,000 Units total) by mouth every 7 (seven) days. 12 capsule 0   No current facility-administered medications on file prior to visit.   Review of Systems All otherwise neg per pt=    Objective:   Physical Exam BP (!) 150/70 (BP Location: Left Arm, Patient Position: Sitting, Cuff Size: Large)   Pulse 74   Temp 97.9 F (36.6 C) (Oral)   Ht 6' 5.5" (1.969 m)   Wt 253 lb (114.8 kg)   SpO2 97%   BMI 29.62 kg/m  VS noted,  Constitutional: Pt appears in NAD HENT: Head: NCAT.  Right Ear: External ear normal.  Left Ear: External ear normal.  Eyes: . Pupils are equal, round, and reactive to light. Conjunctivae and EOM are normal Nose: without d/c or deformity Neck: Neck supple. Gross normal ROM Cardiovascular: Normal rate and regular rhythm.   Pulmonary/Chest: Effort normal and breath sounds without rales or wheezing.  Right knee - trace effusion NT but with possible cyst to the post fossa, + right leg swelling above and below knee 1+ with soreness to the calf Neurological: Pt is alert. At baseline  orientation, motor grossly intact Skin: Skin is warm. No rashes, other new lesions, no LE edema Psychiatric: Pt behavior is normal without agitation  Lab Results  Component Value Date   WBC 5.5 03/30/2020   HGB 14.8 03/30/2020   HCT 46.1 03/30/2020   PLT 209 03/30/2020   GLUCOSE 102 (H) 03/30/2020   CHOL 149 03/30/2020   TRIG 80 03/30/2020   HDL 41 03/30/2020   LDLDIRECT 135.0 12/12/2016   LDLCALC 91 03/30/2020   ALT 20 03/30/2020   AST 19 03/30/2020   NA 137 03/30/2020   K 4.2 03/30/2020   CL 105 03/30/2020   CREATININE 1.14 03/30/2020   BUN 12 03/30/2020   CO2 28 03/30/2020   TSH 0.76 03/30/2020   PSA 1.3 03/30/2020   HGBA1C 5.6 03/30/2020          Assessment & Plan:

## 2020-05-18 NOTE — Assessment & Plan Note (Signed)
Suspect djd, for sport med f/u - possible bakers cyst, for tramadol prn

## 2020-05-20 NOTE — Telephone Encounter (Signed)
Results sent to patient

## 2020-05-24 ENCOUNTER — Ambulatory Visit: Payer: BC Managed Care – PPO | Admitting: Family Medicine

## 2020-05-25 ENCOUNTER — Ambulatory Visit: Payer: Self-pay

## 2020-05-25 ENCOUNTER — Other Ambulatory Visit: Payer: Self-pay

## 2020-05-25 ENCOUNTER — Encounter: Payer: Self-pay | Admitting: Family Medicine

## 2020-05-25 ENCOUNTER — Ambulatory Visit (INDEPENDENT_AMBULATORY_CARE_PROVIDER_SITE_OTHER): Payer: BC Managed Care – PPO | Admitting: Family Medicine

## 2020-05-25 VITALS — BP 130/70 | HR 75 | Ht 77.5 in | Wt 252.0 lb

## 2020-05-25 DIAGNOSIS — M25561 Pain in right knee: Secondary | ICD-10-CM | POA: Diagnosis not present

## 2020-05-25 DIAGNOSIS — M7041 Prepatellar bursitis, right knee: Secondary | ICD-10-CM | POA: Diagnosis not present

## 2020-05-25 DIAGNOSIS — M1711 Unilateral primary osteoarthritis, right knee: Secondary | ICD-10-CM | POA: Diagnosis not present

## 2020-05-25 NOTE — Patient Instructions (Signed)
You had a R knee injection today.  Call or go to the ER if you develop a large red swollen joint with extreme pain or oozing puss.   Please use voltaren gel up to 4x daily for pain as needed.   Please use a knee pad when having to kneel.  Follow-up as needed.

## 2020-05-25 NOTE — Progress Notes (Signed)
Subjective:    I'm seeing this patient as a consultation for:  Dr. Jonny Reid. Note will be routed back to referring provider/PCP.  CC: R knee and leg pain  I, Edward Reid, LAT, ATC, am serving as scribe for Dr. Clementeen Reid.  HPI: Pt is a 54 y/o male presenting w/ c/o R knee and leg pain and swelling.  His symptoms began in early Sept 2021 w/ R knee pain and swelling that worsened and he is now having post knee and calf pain and swelling.  He saw Dr. Jonny Reid on 05/18/20 and a R LE venous doppler US was ordered and was negative for DVT or Baker's cyst.  Today, he locates his pain to both his R ant and post knee .  He works at [employer].  Radiating pain: yes across the front and back of the knee Swelling: yes intermittently but not currently Aggravating factors: deep squatting; kneeling on his R knee; climbing stairs Treatments tried: Tramadol; compression socks but not a knee brace  Diagnostic imaging:  R LE venous doppler US- 05/18/20  Past medical history, Surgical history, Family history, Social history, Allergies, and medications have been entered into the medical record, reviewed.   Review of Systems: No new headache, visual changes, nausea, vomiting, diarrhea, constipation, dizziness, abdominal pain, skin rash, fevers, chills, night sweats, weight loss, swollen lymph nodes, body aches, joint swelling, muscle aches, chest pain, shortness of breath, mood changes, visual or auditory hallucinations.   Objective:    Vitals:   05/25/20 0828  BP: 130/70  Pulse: 75  SpO2: 96%   General: Well Developed, well nourished, and in no acute distress.  Neuro/Psych: Alert and oriented x3, extra-ocular muscles intact, able to move all 4 extremities, sensation grossly intact. Skin: Warm and dry, no rashes noted.  Respiratory: Not using accessory muscles, speaking in full sentences, trachea midline.  Cardiovascular: Pulses palpable, no extremity edema. Abdomen: Does not appear distended. MSK: Right  knee minimal effusion otherwise normal-appearing No significant calf swelling. Normal knee motion with mild crepitation. Tender palpation anterior knee mildly.  Palpable squeak present in anterior knee overlying patella tendon. Stable ligamentous exam. Intact strength.  Right calf no significant swelling nontender.  Pulses cap refill and sensation are intact distally.  Lab and Radiology Results Diagnostic Limited MSK Ultrasound of: Right knee Quad tendon intact normal-appearing Small joint effusion superior patellar space. Patellar tendon intact.  Hyperechoic change speckled throughout distal tendon insertion consistent with mild calcific tendinopathy.   Hypoechoic change present superficial to tendon mildly consistent with minimal prepatellar bursitis. Medial and lateral joint line narrowed degenerative with degenerative appearing meniscus. Posterior knee no Baker's cyst visible. Impression: DJD with effusion  Procedure: Real-time Ultrasound Guided Injection of right knee superior lateral patellar space Device: Philips Affiniti 50G Images permanently stored and available for review in PACS Verbal informed consent obtained.  Discussed risks and benefits of procedure. Warned about infection bleeding damage to structures skin hypopigmentation and fat atrophy among others. Patient expresses understanding and agreement Time-out conducted.   Noted no overlying erythema, induration, or other signs of local infection.   Skin prepped in a sterile fashion.   Local anesthesia: Topical Ethyl chloride.   With sterile technique and under real time ultrasound guidance:  40 mg of Kenalog and 2 mL of Marcaine injected into joint capsule. Fluid seen entering the joint capsule.   Completed without difficulty   Pain immediately resolved suggesting accurate placement of the medication.   Advised to call if fevers/chills, erythema,  induration, drainage, or persistent bleeding.   Images permanently stored  and available for review in the ultrasound unit.  Impression: Technically successful ultrasound guided injection.  VAS [place] LOWER EXTREMITY VENOUS (DVT)  Result Date: 05/20/2020  Lower Venous DVTStudy Indications: Pain, and Edema posterior right knee.  Performing Technologist: Elita Quick RVT  Examination Guidelines: A complete evaluation includes B-mode imaging, spectral Doppler, color Doppler, and power Doppler as needed of all accessible portions of each vessel. Bilateral testing is considered an integral part of a complete examination. Limited examinations for reoccurring indications may be performed as noted. The reflux portion of the exam is performed with the patient in reverse Trendelenburg.  +---------+---------------+---------+-----------+----------+--------------+ RIGHT    CompressibilityPhasicitySpontaneityPropertiesThrombus Aging +---------+---------------+---------+-----------+----------+--------------+ CFV      Full           Yes      Yes                                 +---------+---------------+---------+-----------+----------+--------------+ SFJ      Full           Yes      Yes                                 +---------+---------------+---------+-----------+----------+--------------+ FV Prox  Full           Yes      Yes                                 +---------+---------------+---------+-----------+----------+--------------+ FV Mid   Full           Yes      Yes                                 +---------+---------------+---------+-----------+----------+--------------+ FV DistalFull           Yes      Yes                                 +---------+---------------+---------+-----------+----------+--------------+ PFV      Full           Yes      Yes                                 +---------+---------------+---------+-----------+----------+--------------+ POP      Full           Yes      Yes                                  +---------+---------------+---------+-----------+----------+--------------+ PTV      Full           Yes      Yes                                 +---------+---------------+---------+-----------+----------+--------------+ PERO     Full           Yes      Yes                                 +---------+---------------+---------+-----------+----------+--------------+  GSV      Full           Yes      Yes                                 +---------+---------------+---------+-----------+----------+--------------+ SSV      Full           Yes      Yes                                 +---------+---------------+---------+-----------+----------+--------------+   +----+---------------+---------+-----------+----------+--------------+ LEFTCompressibilityPhasicitySpontaneityPropertiesThrombus Aging +----+---------------+---------+-----------+----------+--------------+ CFV Full           Yes      Yes                                 +----+---------------+---------+-----------+----------+--------------+     Summary: RIGHT: - There is no evidence of deep or superficial vein thrombosis in the lower extremity. No Bakers cyst visualized.   *See table(s) above for measurements and observations. Electronically signed by Lemar Livings MD on 05/20/2020 at 9:18:27 AM.    Final     DVT ultrasound obtained on August 15 personally and independently reviewed..  Impression and Recommendations:    Assessment and Plan: 53 y.o. male with right knee pain.  Fundamentally exacerbation of DJD with probable degenerative meniscus tear the main cause of pain.  He also has some prepatellar bursitis which may be a factor as well.  I do not think the mild calcific patellar tendinopathy has much of an impact to his current pain.  Plan to treat with Voltaren gel and steroid injection as above.  Also recommend padding at anterior knee with activity including kneeling.  It is possible that patient had ruptured  Baker's cyst originally which is now resolved which would explain the temporary calf pain and swelling with negative DVT ultrasound.  Recheck back with me as needed..   Orders Placed This Encounter  Procedures  . [place] LIMITED JOINT SPACE STRUCTURES LOW RIGHT(NO LINKED CHARGES)    Order Specific Question:   Reason for Exam (SYMPTOM  OR DIAGNOSIS REQUIRED)    Answer:   R knee pain    Order Specific Question:   Preferred imaging location?    Answer:   [facility]   No orders of the defined types were placed in this encounter.   Discussed warning signs or symptoms. Please see discharge instructions. Patient expresses understanding.   The above documentation has been reviewed and is accurate and complete Edward Reid, M.D.

## 2020-06-15 ENCOUNTER — Ambulatory Visit (INDEPENDENT_AMBULATORY_CARE_PROVIDER_SITE_OTHER): Payer: BC Managed Care – PPO | Admitting: *Deleted

## 2020-06-15 ENCOUNTER — Other Ambulatory Visit: Payer: Self-pay

## 2020-06-15 DIAGNOSIS — Z23 Encounter for immunization: Secondary | ICD-10-CM

## 2020-09-05 ENCOUNTER — Other Ambulatory Visit: Payer: Self-pay

## 2020-09-05 ENCOUNTER — Telehealth: Payer: Self-pay | Admitting: Internal Medicine

## 2020-09-05 MED ORDER — ATORVASTATIN CALCIUM 20 MG PO TABS
20.0000 mg | ORAL_TABLET | Freq: Every day | ORAL | 3 refills | Status: DC
Start: 2020-09-05 — End: 2021-04-05

## 2020-09-05 MED ORDER — TRAMADOL HCL 50 MG PO TABS
50.0000 mg | ORAL_TABLET | Freq: Four times a day (QID) | ORAL | 0 refills | Status: DC | PRN
Start: 2020-09-05 — End: 2021-10-11

## 2020-09-05 NOTE — Telephone Encounter (Signed)
Patient requesting refill  Refill atorvastatin (LIPITOR) 20 MG tablet sildenafil (VIAGRA) 100 MG tablet  Pharmacy [facility] [phone] [place], [place] - [address]

## 2021-03-21 ENCOUNTER — Other Ambulatory Visit: Payer: Self-pay

## 2021-03-21 ENCOUNTER — Ambulatory Visit: Payer: BC Managed Care – PPO | Admitting: Internal Medicine

## 2021-03-21 ENCOUNTER — Encounter: Payer: Self-pay | Admitting: Internal Medicine

## 2021-03-21 VITALS — BP 148/80 | HR 80 | Temp 98.2°F | Ht 77.5 in | Wt 274.0 lb

## 2021-03-21 DIAGNOSIS — Z0001 Encounter for general adult medical examination with abnormal findings: Secondary | ICD-10-CM

## 2021-03-21 DIAGNOSIS — R7989 Other specified abnormal findings of blood chemistry: Secondary | ICD-10-CM

## 2021-03-21 DIAGNOSIS — R739 Hyperglycemia, unspecified: Secondary | ICD-10-CM

## 2021-03-21 DIAGNOSIS — E538 Deficiency of other specified B group vitamins: Secondary | ICD-10-CM

## 2021-03-21 DIAGNOSIS — M19049 Primary osteoarthritis, unspecified hand: Secondary | ICD-10-CM | POA: Diagnosis not present

## 2021-03-21 DIAGNOSIS — E7800 Pure hypercholesterolemia, unspecified: Secondary | ICD-10-CM | POA: Diagnosis not present

## 2021-03-21 DIAGNOSIS — M25541 Pain in joints of right hand: Secondary | ICD-10-CM | POA: Diagnosis not present

## 2021-03-21 LAB — BASIC METABOLIC PANEL
BUN: 12 mg/dL (ref 6–23)
CO2: 26 mEq/L (ref 19–32)
Calcium: 8.9 mg/dL (ref 8.4–10.5)
Chloride: 106 mEq/L (ref 96–112)
Creatinine, Ser: 0.99 mg/dL (ref 0.40–1.50)
GFR: 84.91 mL/min (ref >60.00)
Glucose, Bld: 95 mg/dL (ref 70–99)
Potassium: 4.2 mEq/L (ref 3.5–5.1)
Sodium: 137 mEq/L (ref 135–145)

## 2021-03-21 LAB — URINALYSIS, ROUTINE W REFLEX MICROSCOPIC
Bilirubin Urine: NEGATIVE
Hgb urine dipstick: NEGATIVE
Ketones, ur: NEGATIVE
Leukocytes,Ua: NEGATIVE
Nitrite: NEGATIVE
RBC / HPF: NONE SEEN (ref 0–?)
Specific Gravity, Urine: 1.025 (ref 1.000–1.030)
Total Protein, Urine: NEGATIVE
Urine Glucose: NEGATIVE
Urobilinogen, UA: 0.2 (ref 0.0–1.0)
pH: 6 (ref 5.0–8.0)

## 2021-03-21 LAB — LIPID PANEL
Cholesterol: 164 mg/dL (ref 0–200)
HDL: 35.8 mg/dL — ABNORMAL LOW (ref >39.00)
LDL Cholesterol: 102 mg/dL — ABNORMAL HIGH (ref 0–99)
NonHDL: 128.53
Total CHOL/HDL Ratio: 5
Triglycerides: 135 mg/dL (ref 0.0–149.0)
VLDL: 27 mg/dL (ref 0.0–40.0)

## 2021-03-21 LAB — CBC WITH DIFFERENTIAL/PLATELET
Basophils Absolute: 0 10*3/uL (ref 0.0–0.1)
Basophils Relative: 0.3 % (ref 0.0–3.0)
Eosinophils Absolute: 0.2 10*3/uL (ref 0.0–0.7)
Eosinophils Relative: 3.5 % (ref 0.0–5.0)
HCT: 42.9 % (ref 39.0–52.0)
Hemoglobin: 14.4 g/dL (ref 13.0–17.0)
Lymphocytes Relative: 33 % (ref 12.0–46.0)
Lymphs Abs: 2.1 10*3/uL (ref 0.7–4.0)
MCHC: 33.6 g/dL (ref 30.0–36.0)
MCV: 84.2 fl (ref 78.0–100.0)
Monocytes Absolute: 0.7 10*3/uL (ref 0.1–1.0)
Monocytes Relative: 11.3 % (ref 3.0–12.0)
Neutro Abs: 3.3 10*3/uL (ref 1.4–7.7)
Neutrophils Relative %: 51.9 % (ref 43.0–77.0)
Platelets: 200 10*3/uL (ref 150.0–400.0)
RBC: 5.09 Mil/uL (ref 4.22–5.81)
RDW: 13.7 % (ref 11.5–15.5)
WBC: 6.3 10*3/uL (ref 4.0–10.5)

## 2021-03-21 LAB — HEPATIC FUNCTION PANEL
ALT: 28 U/L (ref 0–53)
AST: 20 U/L (ref 0–37)
Albumin: 4.1 g/dL (ref 3.5–5.2)
Alkaline Phosphatase: 52 U/L (ref 39–117)
Bilirubin, Direct: 0.1 mg/dL (ref 0.0–0.3)
Total Bilirubin: 0.4 mg/dL (ref 0.2–1.2)
Total Protein: 6.7 g/dL (ref 6.0–8.3)

## 2021-03-21 LAB — VITAMIN B12: Vitamin B-12: 252 pg/mL (ref 211–911)

## 2021-03-21 LAB — VITAMIN D 25 HYDROXY (VIT D DEFICIENCY, FRACTURES): VITD: 22.41 ng/mL — ABNORMAL LOW (ref 30.00–100.00)

## 2021-03-21 LAB — PSA: PSA: 1.22 ng/mL (ref 0.10–4.00)

## 2021-03-21 LAB — HEMOGLOBIN A1C: Hgb A1c MFr Bld: 5.9 % (ref 4.6–6.5)

## 2021-03-21 LAB — TSH: TSH: 1.07 u[IU]/mL (ref 0.35–5.50)

## 2021-03-21 NOTE — Progress Notes (Signed)
Patient ID: Edward Reid, male   DOB: March 21, 1966, 55 y.o.   MRN: [id number]        Chief Complaint: follow up right thumb DIP mass, unocntrolled htn, weight increased, and low vit d       HPI:  JERRALD Reid is a 55 y.o. male here with c/o 1 mo onset worsening tender spongy mass to the DIP right thumb with reduced rom, mild to mod pain, but no overlying skin change, worse to any thumb use, better to rest but has to use the thumb most of the time for his workday.  No trauma, just seemed to come up.  Pt denies chest pain, increased sob or doe, wheezing, orthopnea, PND, increased LE swelling, palpitations, dizziness or syncope.   Pt denies polydipsia, polyuria, or new focal neuro s/s.  BP at home < 140/90, pt is quite sure and checks farily often a few times per wk.  Wt has been increasing and admits to calorie intake out of proportion to his work activity level.  Gained 22 lbs since last visit.  Plans to do better with diet and exercise.  Not taking Vit D.   Pt denies fever, wt loss, night sweats, loss of appetite, or other constitutional symptoms   Wt Readings from Last 3 Encounters:  03/21/21 274 lb (124.3 kg)  05/25/20 252 lb (114.3 kg)  05/18/20 253 lb (114.8 kg)   BP Readings from Last 3 Encounters:  03/21/21 (!) 148/80  05/25/20 130/70  05/18/20 (!) 150/70         Past Medical History:  Diagnosis Date   ALLERGIC RHINITIS    Erectile dysfunction    FASCIITIS, PLANTAR    HYPERCHOLESTEROLEMIA    HYPERLIPIDEMIA    HYPERTENSION    INTERNAL HEMORRHOIDS    Past Surgical History:  Procedure Laterality Date   FACIAL FRACTURE SURGERY     Mass excision from Left forearm     lipoma    reports that he has never smoked. He has never used smokeless tobacco. He reports current alcohol use of about 2.0 standard drinks of alcohol per week. He reports that he does not use drugs. family history includes Coronary artery disease in his father and mother; Diabetes in his mother; Lung cancer in his  father; Prostate cancer in his father. Allergies  Allergen Reactions   Codeine Other (See Comments)    headache   Current Outpatient Medications on File Prior to Visit  Medication Sig Dispense Refill   aspirin EC 81 MG tablet Take 1 tablet (81 mg total) by mouth daily. 90 tablet 11   atorvastatin (LIPITOR) 20 MG tablet Take 1 tablet (20 mg total) by mouth daily. 90 tablet 3   budesonide-formoterol (SYMBICORT) 160-4.5 MCG/ACT inhaler TAKE 2 PUFFS BY MOUTH TWICE A DAY 30.6 Inhaler 3   meclizine (ANTIVERT) 12.5 MG tablet Take 1 tablet (12.5 mg total) by mouth 3 (three) times daily as needed for dizziness. 40 tablet 3   NON FORMULARY Mucous relief pe     sildenafil (VIAGRA) 100 MG tablet Take 0.5-1 tablets (50-100 mg total) by mouth daily as needed for erectile dysfunction. 5 tablet 11   traMADol (ULTRAM) 50 MG tablet Take 1 tablet (50 mg total) by mouth every 6 (six) hours as needed. 30 tablet 0   Vitamin D, Ergocalciferol, (DRISDOL) 1.25 MG (50000 UNIT) CAPS capsule Take 1 capsule (50,000 Units total) by mouth every 7 (seven) days. 12 capsule 0   No current facility-administered medications on file  prior to visit.        ROS:  All others reviewed and negative.  Objective        PE:  BP (!) 148/80 (BP Location: Left Arm, Patient Position: Sitting, Cuff Size: Large)   Pulse 80   Temp 98.2 F (36.8 C) (Oral)   Ht 6' 5.5" (1.969 m)   Wt 274 lb (124.3 kg)   SpO2 96%   BMI 32.07 kg/m                 Constitutional: Pt appears in NAD               HENT: Head: NCAT.                Right Ear: External ear normal.                 Left Ear: External ear normal.                Eyes: . Pupils are equal, round, and reactive to light. Conjunctivae and EOM are normal               Nose: without d/c or deformity               Neck: Neck supple. Gross normal ROM               Cardiovascular: Normal rate and regular rhythm.                 Pulmonary/Chest: Effort normal and breath sounds without  rales or wheezing.                Right thumb with DIP spongy firm mass mild tender with reduced ROM, also wiwth multiple tender hand finger joint OA hanges               Neurological: Pt is alert. At baseline orientation, motor grossly intact               Skin: Skin is warm. No rashes, no other new lesions, LE edema - none               Psychiatric: Pt behavior is normal without agitation   Micro: none  Cardiac tracings I have personally interpreted today:  none  Pertinent Radiological findings (summarize): none   Lab Results  Component Value Date   WBC 6.3 03/21/2021   HGB 14.4 03/21/2021   HCT 42.9 03/21/2021   PLT 200.0 03/21/2021   GLUCOSE 95 03/21/2021   CHOL 164 03/21/2021   TRIG 135.0 03/21/2021   HDL 35.80 (L) 03/21/2021   LDLDIRECT 135.0 12/12/2016   LDLCALC 102 (H) 03/21/2021   ALT 28 03/21/2021   AST 20 03/21/2021   NA 137 03/21/2021   K 4.2 03/21/2021   CL 106 03/21/2021   CREATININE 0.99 03/21/2021   BUN 12 03/21/2021   CO2 26 03/21/2021   TSH 1.07 03/21/2021   PSA 1.22 03/21/2021   HGBA1C 5.9 03/21/2021   Assessment/Plan:  Edward Reid is a 55 y.o. Black or African American [2] male with  has a past medical history of ALLERGIC RHINITIS, Erectile dysfunction, FASCIITIS, PLANTAR, HYPERCHOLESTEROLEMIA, HYPERLIPIDEMIA, HYPERTENSION, and INTERNAL HEMORRHOIDS.  Low vitamin D level Last vitamin D Lab Results  Component Value Date   VD25OH 17 (L) 03/30/2020   Low, to start oral replacement   Pain in thumb joint with movement, right C/w cystic mass to the right thumb CIP, for volt gel prn,  also refer hand surgury  Hyperglycemia Lab Results  Component Value Date   HGBA1C 5.9 03/21/2021   Stable, pt to continue current medical treatment  - diet   HYPERCHOLESTEROLEMIA Lab Results  Component Value Date   LDLCALC 102 (H) 03/21/2021   Uncontrolled, goal ldl < 100, pt to continue current statin  - lipitor as declines to change   Hand arthritis Also  for volt gel prn,  to f/u any worsening symptoms or concerns  Followup: Return in about 15 days (around 04/05/2021).  Oliver Barre, MD 03/23/2021 11:34 PM [facility]

## 2021-03-21 NOTE — Assessment & Plan Note (Signed)
Last vitamin D Lab Results  Component Value Date   VD25OH 17 (L) 03/30/2020   Low, to start oral replacement

## 2021-03-21 NOTE — Patient Instructions (Addendum)
Ok to take tylenol or advil as needed for pain, as well as the OTC Voltaren gel as needed  Please continue all other medications as before, and refills have been done if requested.  Please have the pharmacy call with any other refills you may need.  Please keep your appointments with your specialists as you may have planned  You will be contacted regarding the referral for: hand surgury  Please make an Appointment to return April 05 2021 as planned, and please plan to have your labs done a few days ahead at the [facility] site

## 2021-03-21 NOTE — Telephone Encounter (Signed)
I was able to speak with and inform him of his labs a/w Dr. Melvyn Novas advice. Pt states he understood and has no questions or concerns at this time.

## 2021-03-23 ENCOUNTER — Encounter: Payer: Self-pay | Admitting: Internal Medicine

## 2021-03-23 NOTE — Assessment & Plan Note (Signed)
Also for volt gel prn,  to f/u any worsening symptoms or concerns 

## 2021-03-23 NOTE — Assessment & Plan Note (Signed)
Lab Results  Component Value Date   LDLCALC 102 (H) 03/21/2021   Uncontrolled, goal ldl < 100, pt to continue current statin  - lipitor as declines to change

## 2021-03-23 NOTE — Assessment & Plan Note (Signed)
Lab Results  Component Value Date   HGBA1C 5.9 03/21/2021   Stable, pt to continue current medical treatment  - diet

## 2021-03-23 NOTE — Assessment & Plan Note (Signed)
C/w cystic mass to the right thumb CIP, for volt gel prn, also refer hand surgury

## 2021-04-05 ENCOUNTER — Ambulatory Visit (INDEPENDENT_AMBULATORY_CARE_PROVIDER_SITE_OTHER): Payer: BC Managed Care – PPO | Admitting: Internal Medicine

## 2021-04-05 ENCOUNTER — Other Ambulatory Visit: Payer: Self-pay

## 2021-04-05 VITALS — BP 142/80 | HR 75 | Temp 98.5°F | Ht 77.5 in | Wt 266.0 lb

## 2021-04-05 DIAGNOSIS — E7800 Pure hypercholesterolemia, unspecified: Secondary | ICD-10-CM | POA: Diagnosis not present

## 2021-04-05 DIAGNOSIS — R739 Hyperglycemia, unspecified: Secondary | ICD-10-CM

## 2021-04-05 DIAGNOSIS — I872 Venous insufficiency (chronic) (peripheral): Secondary | ICD-10-CM

## 2021-04-05 DIAGNOSIS — E538 Deficiency of other specified B group vitamins: Secondary | ICD-10-CM | POA: Diagnosis not present

## 2021-04-05 DIAGNOSIS — I10 Essential (primary) hypertension: Secondary | ICD-10-CM | POA: Diagnosis not present

## 2021-04-05 DIAGNOSIS — Z0001 Encounter for general adult medical examination with abnormal findings: Secondary | ICD-10-CM

## 2021-04-05 DIAGNOSIS — Z23 Encounter for immunization: Secondary | ICD-10-CM

## 2021-04-05 DIAGNOSIS — R7989 Other specified abnormal findings of blood chemistry: Secondary | ICD-10-CM | POA: Diagnosis not present

## 2021-04-05 MED ORDER — CHOLECALCIFEROL 50 MCG (2000 UT) PO TABS: ORAL_TABLET | ORAL | 99 refills | Status: AC

## 2021-04-05 MED ORDER — LOSARTAN POTASSIUM 50 MG PO TABS: 50.0000 mg | ORAL_TABLET | Freq: Every day | ORAL | 3 refills | Status: DC

## 2021-04-05 MED ORDER — ATORVASTATIN CALCIUM 40 MG PO TABS: 40.0000 mg | ORAL_TABLET | Freq: Every day | ORAL | 3 refills | Status: DC

## 2021-04-05 NOTE — Assessment & Plan Note (Signed)
Lab Results  Component Value Date   HGBA1C 5.9 03/21/2021   Stable, pt to continue current medical treatment  - diet and wt control

## 2021-04-05 NOTE — Assessment & Plan Note (Addendum)
Lab Results  Component Value Date   LDLCALC 102 (H) 03/21/2021   Mild uncontrolled, ok for increase lipitor 40 qd

## 2021-04-05 NOTE — Assessment & Plan Note (Addendum)
BP Readings from Last 3 Encounters:  03/21/21 (!) 148/80  05/25/20 130/70  05/18/20 (!) 150/70   Uncontrolled today mild as well,   pt to start losartan 50 qd

## 2021-04-05 NOTE — Assessment & Plan Note (Signed)
Chronic stable, cont compression stockings daily

## 2021-04-05 NOTE — Assessment & Plan Note (Signed)
Last vitamin D Lab Results  Component Value Date   VD25OH 22.41 (L) 03/21/2021   Low, to start oral replacement

## 2021-04-05 NOTE — Progress Notes (Signed)
Chief Complaint:: wellness exam and htn uncontrolled, vertigo, hld, low vit d       HPI:  Edward Reid is a 55 y.o. male here for wellness exam; declines covid booster, flu shot, shingrix, o/w up to date with preventive referrals and immunizations                        Also has gained wt with less active, more calories, but lost several since last visit intentionally.  Has difficulty lotsing walk due to work schedule and unable to walk as much as he likes. 2 grandchildren living with him.  Pt denies chest pain, increased sob or doe, wheezing, orthopnea, PND, palpitations, or syncope but has intermittent vertigo with position change mild int the past wk, and ongoing trace swelling to the distal legs    Trying to follow low chol diet.  Taking vit d 2000 u qd.  Good compliance with meds overall.   Pt denies polydipsia, polyuria, or new focal neuro s/s.   Pt denies fever, wt loss, night sweats, loss of appetite, or other constitutional symptoms      Wt Readings from Last 3 Encounters:  04/05/21 266 lb (120.7 kg)  03/21/21 274 lb (124.3 kg)  05/25/20 252 lb (114.3 kg)   BP Readings from Last 3 Encounters:  04/05/21 (!) 142/80  03/21/21 (!) 148/80  05/25/20 130/70   Immunization History  Administered Date(s) Administered   Influenza,inj,Quad PF,6+ Mos 08/10/2014, 05/07/2017, 06/15/2020   PFIZER(Purple Top)SARS-COV-2 Vaccination 11/26/2019, 12/21/2019, 08/25/2020   Td 09/04/1996, 09/29/2010   Tdap 04/05/2021   There are no preventive care reminders to display for this patient.     Past Medical History:  Diagnosis Date   ALLERGIC RHINITIS    Erectile dysfunction    FASCIITIS, PLANTAR    HYPERCHOLESTEROLEMIA    HYPERLIPIDEMIA    HYPERTENSION    INTERNAL HEMORRHOIDS    Past Surgical History:  Procedure Laterality Date   FACIAL FRACTURE SURGERY     Mass excision from Left forearm     lipoma    reports that he has never smoked. He has never used smokeless tobacco. He reports  current alcohol use of about 2.0 standard drinks of alcohol per week. He reports that he does not use drugs. family history includes Coronary artery disease in his father and mother; Diabetes in his mother; Lung cancer in his father; Prostate cancer in his father. Allergies  Allergen Reactions   Codeine Other (See Comments)    headache   Current Outpatient Medications on File Prior to Visit  Medication Sig Dispense Refill   aspirin EC 81 MG tablet Take 1 tablet (81 mg total) by mouth daily. 90 tablet 11   budesonide-formoterol (SYMBICORT) 160-4.5 MCG/ACT inhaler TAKE 2 PUFFS BY MOUTH TWICE A DAY 30.6 Inhaler 3   NON FORMULARY Mucous relief pe     sildenafil (VIAGRA) 100 MG tablet Take 0.5-1 tablets (50-100 mg total) by mouth daily as needed for erectile dysfunction. 5 tablet 11   traMADol (ULTRAM) 50 MG tablet Take 1 tablet (50 mg total) by mouth every 6 (six) hours as needed. 30 tablet 0   No current facility-administered medications on file prior to visit.        ROS:  All others reviewed and negative.  Objective        PE:  BP (!) 142/80 (BP Location: Left Arm, Patient Position: Sitting, Cuff Size: Normal)  Pulse 75   Temp 98.5 F (36.9 C) (Oral)   Ht 6' 5.5" (1.969 m)   Wt 266 lb (120.7 kg)   SpO2 96%   BMI 31.14 kg/m                 Constitutional: Pt appears in NAD               HENT: Head: NCAT.                Right Ear: External ear normal.                 Left Ear: External ear normal.                Eyes: . Pupils are equal, round, and reactive to light. Conjunctivae and EOM are normal               Nose: without d/c or deformity               Neck: Neck supple. Gross normal ROM               Cardiovascular: Normal rate and regular rhythm.                 Pulmonary/Chest: Effort normal and breath sounds without rales or wheezing.                Abd:  Soft, NT, ND, + BS, no organomegaly               Neurological: Pt is alert. At baseline orientation, motor grossly  intact               Skin: Skin is warm. No rashes, no other new lesions, LE edema - trace bilat               Psychiatric: Pt behavior is normal without agitation   Micro: none  Cardiac tracings I have personally interpreted today:  none  Pertinent Radiological findings (summarize): none   Lab Results  Component Value Date   WBC 6.3 03/21/2021   HGB 14.4 03/21/2021   HCT 42.9 03/21/2021   PLT 200.0 03/21/2021   GLUCOSE 95 03/21/2021   CHOL 164 03/21/2021   TRIG 135.0 03/21/2021   HDL 35.80 (L) 03/21/2021   LDLDIRECT 135.0 12/12/2016   LDLCALC 102 (H) 03/21/2021   ALT 28 03/21/2021   AST 20 03/21/2021   NA 137 03/21/2021   K 4.2 03/21/2021   CL 106 03/21/2021   CREATININE 0.99 03/21/2021   BUN 12 03/21/2021   CO2 26 03/21/2021   TSH 1.07 03/21/2021   PSA 1.22 03/21/2021   HGBA1C 5.9 03/21/2021   Assessment/Plan:  Edward Reid is a 55 y.o. Black or African American [2] male with  has a past medical history of ALLERGIC RHINITIS, Erectile dysfunction, FASCIITIS, PLANTAR, HYPERCHOLESTEROLEMIA, HYPERLIPIDEMIA, HYPERTENSION, and INTERNAL HEMORRHOIDS.  Low vitamin D level Last vitamin D Lab Results  Component Value Date   VD25OH 22.41 (L) 03/21/2021   Low, to start oral replacement   Hyperglycemia Lab Results  Component Value Date   HGBA1C 5.9 03/21/2021   Stable, pt to continue current medical treatment  - diet and wt control   Essential hypertension BP Readings from Last 3 Encounters:  03/21/21 (!) 148/80  05/25/20 130/70  05/18/20 (!) 150/70   Uncontrolled today mild as well,   pt to start losartan 50 qd    HYPERCHOLESTEROLEMIA Lab Results  Component Value Date  LDLCALC 102 (H) 03/21/2021   Mild uncontrolled, ok for increase lipitor 40 qd   Chronic venous insufficiency Chronic stable, cont compression stockings daily  Encounter for well adult exam with abnormal findings Age and sex appropriate education and counseling updated with regular  exercise and diet Referrals for preventative services - none needed Immunizations addressed - declines covid, flu shot, shingrix but ok for Tdap today Smoking counseling  - none needed Evidence for depression or other mood disorder - none significant Most recent labs reviewed. I have personally reviewed and have noted: 1) the patient's medical and social history 2) The patient's current medications and supplements 3) The patient's height, weight, and BMI have been recorded in the chart  Followup: Return in about 1 year (around 04/05/2022).  Oliver Barre, MD 04/08/2021 7:59 PM [facility]

## 2021-04-05 NOTE — Patient Instructions (Addendum)
Please check with your insurance about the shingles shot  You had the Tdap tetanus shot today  Please take all new medication as prescribed  - the losartan 50 mg per day  Ok to increase the lipitor to 40 mg per day  Please take OTC Vitamin D3 at 4000 units   Please continue all other medications as before, and refills have been done if requested.  Please have the pharmacy call with any other refills you may need.  Please continue your efforts at being more active, low cholesterol diet, and weight control.  You are otherwise up to date with prevention measures today.  Please keep your appointments with your specialists as you may have planned  Please make an Appointment to return for your 1 year visit, or sooner if needed, with Lab testing by Appointment as well, to be done about 3-5 days before at the FIRST FLOOR Lab (so this is for TWO appointments - please see the scheduling desk as you leave)  Due to the ongoing Covid 19 pandemic, our lab now requires an appointment for any labs done at our office.  If you need labs done and do not have an appointment, please call our office ahead of time to schedule before presenting to the lab for your testing.

## 2021-04-08 ENCOUNTER — Encounter: Payer: Self-pay | Admitting: Internal Medicine

## 2021-04-08 NOTE — Assessment & Plan Note (Addendum)
Age and sex appropriate education and counseling updated with regular exercise and diet Referrals for preventative services - none needed Immunizations addressed - declines covid, flu shot, shingrix but ok for Tdap today Smoking counseling  - none needed Evidence for depression or other mood disorder - none significant Most recent labs reviewed. I have personally reviewed and have noted: 1) the patient's medical and social history 2) The patient's current medications and supplements 3) The patient's height, weight, and BMI have been recorded in the chart

## 2021-04-11 ENCOUNTER — Encounter: Payer: Self-pay | Admitting: Orthopaedic Surgery

## 2021-04-11 ENCOUNTER — Ambulatory Visit (INDEPENDENT_AMBULATORY_CARE_PROVIDER_SITE_OTHER): Payer: BC Managed Care – PPO | Admitting: Orthopaedic Surgery

## 2021-04-11 ENCOUNTER — Ambulatory Visit: Payer: Self-pay

## 2021-04-11 ENCOUNTER — Other Ambulatory Visit: Payer: Self-pay

## 2021-04-11 DIAGNOSIS — M79644 Pain in right finger(s): Secondary | ICD-10-CM | POA: Diagnosis not present

## 2021-04-11 NOTE — Progress Notes (Signed)
Office Visit Note   Patient: Edward Reid           Date of Birth: 03-31-1966           MRN: [id number] Visit Date: 04/11/2021              Requested by: Corwin Levins, MD [address] [place],  [place] [address] PCP: Corwin Levins, MD   Assessment & Plan: Visit Diagnoses:  1. Pain of right thumb     Plan: Based on findings impression is right thumb mucous cyst.  He has a small dorsal osteophyte from the proximal phalanx.  Treatment options were discussed today and he would like to just monitor this for now.  I discussed aspiration is usually unsuccessful for this condition.  Follow-Up Instructions: Return if symptoms worsen or fail to improve.   Orders:  Orders Placed This Encounter  Procedures   XR Finger Thumb Right   No orders of the defined types were placed in this encounter.     Procedures: No procedures performed   Clinical Data: No additional findings.   Subjective: Chief Complaint  Patient presents with   Right Thumb - Pain    Edward Reid is a 55 year old gentleman who comes in for evaluation of a painful mass and cyst to the right thumb for about a month.  Denies any injuries.  He works several jobs and uses right hand constantly.  He has noticed that the mass fluctuates in size from time to time.  He feels some numbness to the tip of the thumb tip when he squeezes the mass.   Review of Systems  Constitutional: Negative.   All other systems reviewed and are negative.   Objective: Vital Signs: There were no vitals taken for this visit.  Physical Exam Vitals and nursing note reviewed.  Constitutional:      Appearance: He is well-developed.  HENT:     Head: Normocephalic and atraumatic.  Eyes:     Pupils: Pupils are equal, round, and reactive to light.  Pulmonary:     Effort: Pulmonary effort is normal.  Abdominal:     Palpations: Abdomen is soft.  Musculoskeletal:        General: Normal range of motion.     Cervical back: Neck supple.   Skin:    General: Skin is warm.  Neurological:     Mental Status: He is alert and oriented to person, place, and time.  Psychiatric:        Behavior: Behavior normal.        Thought Content: Thought content normal.        Judgment: Judgment normal.    Ortho Exam Right thumb shows a pea-sized firm mass on the dorsal radial aspect of the IP joint.  Clinically the appearance is consistent with mucous cyst.  There are no changes to the nail.  There is no drainage or compromise to the overlying skin.  Range of motion of the IP joint is supple. Specialty Comments:  No specialty comments available.  Imaging: XR Finger Thumb Right  Result Date: 04/11/2021 Mild arthritic changes of the IP joint with dorsal osteophyte.    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/12/2016   Cough 08/11/2015   Wheezing 08/11/2015   Skin lesion 08/10/2014   Encounter for well adult exam with abnormal findings 01/16/2012   Erectile dysfunction    ORTHOSTATIC DIZZINESS 04/06/2010   Essential hypertension 05/19/2008   INTERNAL HEMORRHOIDS 04/13/2008   HYPERCHOLESTEROLEMIA 06/24/2007   Allergic rhinitis 06/24/2007   FASCIITIS, PLANTAR 06/24/2007   Past Medical History:  Diagnosis Date   ALLERGIC RHINITIS    Erectile dysfunction    FASCIITIS, PLANTAR    HYPERCHOLESTEROLEMIA    HYPERLIPIDEMIA    HYPERTENSION    INTERNAL HEMORRHOIDS     Family History  Problem Relation Age of Onset   Diabetes Mother    Coronary artery disease Mother        hx CABG   Coronary artery disease Father        Hx MI   Prostate cancer Father    Lung cancer Father    Colon cancer  Neg Hx    Esophageal cancer Neg Hx    Rectal cancer Neg Hx    Stomach cancer Neg Hx     Past Surgical History:  Procedure Laterality Date   FACIAL FRACTURE SURGERY     Mass excision from Left forearm     lipoma   Social History   Occupational History   Not on file  Tobacco Use   Smoking status: Never   Smokeless tobacco: Never  Substance and Sexual Activity   Alcohol use: Yes    Alcohol/week: 2.0 standard drinks    Types: 2 Cans of beer per week   Drug use: No   Sexual activity: Not on file

## 2021-07-12 ENCOUNTER — Other Ambulatory Visit: Payer: Self-pay | Admitting: Internal Medicine

## 2021-10-02 ENCOUNTER — Telehealth: Payer: Self-pay | Admitting: Internal Medicine

## 2021-10-02 NOTE — Telephone Encounter (Signed)
Pt requesting a c/b to discuss if he should continue taking losartan (COZAAR) 50 MG tablet

## 2021-10-02 NOTE — Telephone Encounter (Signed)
Please consider ROV as this would be difficult over the phone

## 2021-10-03 NOTE — Telephone Encounter (Signed)
Patient apt has been made.  

## 2021-10-11 ENCOUNTER — Encounter: Payer: Self-pay | Admitting: Internal Medicine

## 2021-10-11 ENCOUNTER — Other Ambulatory Visit: Payer: Self-pay

## 2021-10-11 ENCOUNTER — Ambulatory Visit (INDEPENDENT_AMBULATORY_CARE_PROVIDER_SITE_OTHER): Payer: Managed Care, Other (non HMO) | Admitting: Internal Medicine

## 2021-10-11 VITALS — BP 150/72 | HR 73 | Temp 98.1°F | Ht 77.5 in | Wt 266.8 lb

## 2021-10-11 DIAGNOSIS — R739 Hyperglycemia, unspecified: Secondary | ICD-10-CM

## 2021-10-11 DIAGNOSIS — R7989 Other specified abnormal findings of blood chemistry: Secondary | ICD-10-CM

## 2021-10-11 DIAGNOSIS — M25541 Pain in joints of right hand: Secondary | ICD-10-CM | POA: Diagnosis not present

## 2021-10-11 DIAGNOSIS — Z0001 Encounter for general adult medical examination with abnormal findings: Secondary | ICD-10-CM

## 2021-10-11 DIAGNOSIS — E538 Deficiency of other specified B group vitamins: Secondary | ICD-10-CM

## 2021-10-11 DIAGNOSIS — I10 Essential (primary) hypertension: Secondary | ICD-10-CM

## 2021-10-11 DIAGNOSIS — E7800 Pure hypercholesterolemia, unspecified: Secondary | ICD-10-CM

## 2021-10-11 LAB — CBC WITH DIFFERENTIAL/PLATELET
Basophils Absolute: 0 10*3/uL (ref 0.0–0.1)
Basophils Relative: 0.4 % (ref 0.0–3.0)
Eosinophils Absolute: 0.3 10*3/uL (ref 0.0–0.7)
Eosinophils Relative: 4.5 % (ref 0.0–5.0)
HCT: 44.1 % (ref 39.0–52.0)
Hemoglobin: 14.6 g/dL (ref 13.0–17.0)
Lymphocytes Relative: 34.7 % (ref 12.0–46.0)
Lymphs Abs: 2.3 10*3/uL (ref 0.7–4.0)
MCHC: 33.1 g/dL (ref 30.0–36.0)
MCV: 83.7 fl (ref 78.0–100.0)
Monocytes Absolute: 0.6 10*3/uL (ref 0.1–1.0)
Monocytes Relative: 8.6 % (ref 3.0–12.0)
Neutro Abs: 3.4 10*3/uL (ref 1.4–7.7)
Neutrophils Relative %: 51.8 % (ref 43.0–77.0)
Platelets: 213 10*3/uL (ref 150.0–400.0)
RBC: 5.27 Mil/uL (ref 4.22–5.81)
RDW: 13.7 % (ref 11.5–15.5)
WBC: 6.6 10*3/uL (ref 4.0–10.5)

## 2021-10-11 LAB — URINALYSIS, ROUTINE W REFLEX MICROSCOPIC
Bilirubin Urine: NEGATIVE
Hgb urine dipstick: NEGATIVE
Ketones, ur: NEGATIVE
Leukocytes,Ua: NEGATIVE
Nitrite: NEGATIVE
RBC / HPF: NONE SEEN (ref 0–?)
Specific Gravity, Urine: >=1.03 — AB (ref 1.000–1.030)
Total Protein, Urine: NEGATIVE
Urine Glucose: NEGATIVE
Urobilinogen, UA: 1 (ref 0.0–1.0)
pH: 5.5 (ref 5.0–8.0)

## 2021-10-11 LAB — LIPID PANEL
Cholesterol: 141 mg/dL (ref 0–200)
HDL: 34.4 mg/dL — ABNORMAL LOW (ref >39.00)
LDL Cholesterol: 82 mg/dL (ref 0–99)
NonHDL: 106.57
Total CHOL/HDL Ratio: 4
Triglycerides: 123 mg/dL (ref 0.0–149.0)
VLDL: 24.6 mg/dL (ref 0.0–40.0)

## 2021-10-11 LAB — BASIC METABOLIC PANEL
BUN: 9 mg/dL (ref 6–23)
CO2: 26 mEq/L (ref 19–32)
Calcium: 9.3 mg/dL (ref 8.4–10.5)
Chloride: 106 mEq/L (ref 96–112)
Creatinine, Ser: 1.05 mg/dL (ref 0.40–1.50)
GFR: 78.81 mL/min (ref >60.00)
Glucose, Bld: 104 mg/dL — ABNORMAL HIGH (ref 70–99)
Potassium: 4.1 mEq/L (ref 3.5–5.1)
Sodium: 142 mEq/L (ref 135–145)

## 2021-10-11 LAB — TSH: TSH: 1.01 u[IU]/mL (ref 0.35–5.50)

## 2021-10-11 LAB — HEPATIC FUNCTION PANEL
ALT: 18 U/L (ref 0–53)
AST: 14 U/L (ref 0–37)
Albumin: 4 g/dL (ref 3.5–5.2)
Alkaline Phosphatase: 72 U/L (ref 39–117)
Bilirubin, Direct: 0.1 mg/dL (ref 0.0–0.3)
Total Bilirubin: 0.3 mg/dL (ref 0.2–1.2)
Total Protein: 7 g/dL (ref 6.0–8.3)

## 2021-10-11 LAB — HEMOGLOBIN A1C: Hgb A1c MFr Bld: 5.9 % (ref 4.6–6.5)

## 2021-10-11 LAB — VITAMIN D 25 HYDROXY (VIT D DEFICIENCY, FRACTURES): VITD: 21.73 ng/mL — ABNORMAL LOW (ref 30.00–100.00)

## 2021-10-11 LAB — PSA: PSA: 1.34 ng/mL (ref 0.10–4.00)

## 2021-10-11 LAB — VITAMIN B12: Vitamin B-12: 270 pg/mL (ref 211–911)

## 2021-10-11 MED ORDER — LOSARTAN POTASSIUM 50 MG PO TABS: 50.0000 mg | ORAL_TABLET | Freq: Every day | ORAL | 3 refills | Status: DC

## 2021-10-11 NOTE — Assessment & Plan Note (Signed)

## 2021-10-11 NOTE — Assessment & Plan Note (Signed)
Lab Results  Component Value Date   LDLCALC 82 10/11/2021   Stable, pt to continue current statin lipitor 40

## 2021-10-11 NOTE — Progress Notes (Signed)
Patient ID: Edward Reid, male   DOB: [birth year], 56 y.o.   MRN: [id number]         Chief Complaint:: wellness exam and htn, hld, hyperglycemia, low vit d, right thumb DIP pain       HPI:  Edward Reid is a 56 y.o. male here for wellness exam; declines covid booster, ow up to date                        Also for some reason just did not start losartan after last visit, he meant to but just did not ever get started.  Willing to do so now.  Pt denies chest pain, increased sob or doe, wheezing, orthopnea, PND, increased LE swelling, palpitations, dizziness or syncope.   Pt denies polydipsia, polyuria, or new focal neuro s/s.   Pt denies fever, wt loss, night sweats, loss of appetite, or other constitutional symptoms  Not taking Vit D    does have right thumb DIP joint mass mild worsening size and pain just incidentally in the past day; had been doing well after seeing hand surgeon and tx conserviately, but pt has been working more with hand in the past wk.   Wt Readings from Last 3 Encounters:  10/11/21 266 lb 12.8 oz (121 kg)  04/05/21 266 lb (120.7 kg)  03/21/21 274 lb (124.3 kg)   BP Readings from Last 3 Encounters:  10/11/21 (!) 150/72  04/05/21 (!) 142/80  03/21/21 (!) 148/80   Immunization History  Administered Date(s) Administered   Influenza,inj,Quad PF,6+ Mos 08/10/2014, 05/07/2017, 06/15/2020   PFIZER(Purple Top)SARS-COV-2 Vaccination 11/26/2019, 12/21/2019, 08/25/2020   Td 09/04/1996, 09/29/2010   Tdap 04/05/2021   Zoster Recombinat (Shingrix) 05/04/2021, 08/03/2021   There are no preventive care reminders to display for this patient.     Past Medical History:  Diagnosis Date   ALLERGIC RHINITIS    Erectile dysfunction    FASCIITIS, PLANTAR    HYPERCHOLESTEROLEMIA    HYPERLIPIDEMIA    HYPERTENSION    INTERNAL HEMORRHOIDS    Past Surgical History:  Procedure Laterality Date   FACIAL FRACTURE SURGERY     Mass excision from Left forearm     lipoma    reports that he  has never smoked. He has never used smokeless tobacco. He reports current alcohol use of about 2.0 standard drinks per week. He reports that he does not use drugs. family history includes Coronary artery disease in his father and mother; Diabetes in his mother; Lung cancer in his father; Prostate cancer in his father. Allergies  Allergen Reactions   Codeine Other (See Comments)    headache   Current Outpatient Medications on File Prior to Visit  Medication Sig Dispense Refill   aspirin EC 81 MG tablet Take 1 tablet (81 mg total) by mouth daily. 90 tablet 11   atorvastatin (LIPITOR) 40 MG tablet Take 1 tablet (40 mg total) by mouth daily. 90 tablet 3   budesonide-formoterol (SYMBICORT) 160-4.5 MCG/ACT inhaler TAKE 2 PUFFS BY MOUTH TWICE A DAY 30.6 Inhaler 3   Cholecalciferol 50 MCG (2000 UT) TABS 2 tab by mouth once daily 60 tablet 99   meclizine (ANTIVERT) 12.5 MG tablet TAKE 1 TABLET (12.5 MG TOTAL) BY MOUTH 3 (THREE) TIMES DAILY AS NEEDED FOR DIZZINESS. 40 tablet 3   NON FORMULARY Mucous relief pe     sildenafil (VIAGRA) 100 MG tablet Take 0.5-1 tablets (50-100 mg total) by mouth daily as needed for  erectile dysfunction. 5 tablet 11   No current facility-administered medications on file prior to visit.        ROS:  All others reviewed and negative.  Objective        PE:  BP (!) 150/72 (BP Location: Right Arm, Patient Position: Sitting, Cuff Size: Large)    Pulse 73    Temp 98.1 F (36.7 C) (Oral)    Ht 6' 5.5" (1.969 m)    Wt 266 lb 12.8 oz (121 kg)    SpO2 97%    BMI 31.23 kg/m                 Constitutional: Pt appears in NAD               HENT: Head: NCAT.                Right Ear: External ear normal.                 Left Ear: External ear normal.                Eyes: . Pupils are equal, round, and reactive to light. Conjunctivae and EOM are normal               Nose: without d/c or deformity               Neck: Neck supple. Gross normal ROM               Cardiovascular:  Normal rate and regular rhythm.                 Pulmonary/Chest: Effort normal and breath sounds without rales or wheezing.                Abd:  Soft, NT, ND, + BS, no organomegaly               Neurological: Pt is alert. At baseline orientation, motor grossly intact               Right thumb DIP with mild tender cystic aspect with reduced rom               Skin: Skin is warm. No rashes, no other new lesions, LE edema - none               Psychiatric: Pt behavior is normal without agitation   Micro: none  Cardiac tracings I have personally interpreted today:  none  Pertinent Radiological findings (summarize): none   Lab Results  Component Value Date   WBC 6.6 10/11/2021   HGB 14.6 10/11/2021   HCT 44.1 10/11/2021   PLT 213.0 10/11/2021   GLUCOSE 104 (H) 10/11/2021   CHOL 141 10/11/2021   TRIG 123.0 10/11/2021   HDL 34.40 (L) 10/11/2021   LDLDIRECT 135.0 12/12/2016   LDLCALC 82 10/11/2021   ALT 18 10/11/2021   AST 14 10/11/2021   NA 142 10/11/2021   K 4.1 10/11/2021   CL 106 10/11/2021   CREATININE 1.05 10/11/2021   BUN 9 10/11/2021   CO2 26 10/11/2021   TSH 1.01 10/11/2021   PSA 1.34 10/11/2021   HGBA1C 5.9 10/11/2021   Assessment/Plan:  Edward Reid is a 56 y.o. Black or African American [2] male with  has a past medical history of ALLERGIC RHINITIS, Erectile dysfunction, FASCIITIS, PLANTAR, HYPERCHOLESTEROLEMIA, HYPERLIPIDEMIA, HYPERTENSION, and INTERNAL HEMORRHOIDS.  Low vitamin D level Last vitamin D Lab Results  Component Value Date  VD25OH 22.41 (L) 03/21/2021   Low, to start oral replacement   Pain in thumb joint with movement, right Improved then worsened again yesterday, for volt gel prn  Encounter for well adult exam with abnormal findings Age and sex appropriate education and counseling updated with regular exercise and diet Referrals for preventative services - none needed Immunizations addressed - declines covid booster Smoking counseling  - none  needed Evidence for depression or other mood disorder - none significant Most recent labs reviewed. I have personally reviewed and have noted: 1) the patient's medical and social history 2) The patient's current medications and supplements 3) The patient's height, weight, and BMI have been recorded in the chart   Essential hypertension BP Readings from Last 3 Encounters:  10/11/21 (!) 150/72  04/05/21 (!) 142/80  03/21/21 (!) 148/80   uncontrolled, pt to start losartan 50 qd   HYPERCHOLESTEROLEMIA Lab Results  Component Value Date   LDLCALC 82 10/11/2021   Stable, pt to continue current statin lipitor 40   Hyperglycemia Lab Results  Component Value Date   HGBA1C 5.9 10/11/2021   Stable, pt to continue current medical treatment  - diet  Followup: Return in about 6 months (around 04/10/2022).  Oliver Barre, MD 10/11/2021 8:32 PM [facility] - [facility]

## 2021-10-11 NOTE — Assessment & Plan Note (Signed)
Lab Results  °Component Value Date  ° HGBA1C 5.9 10/11/2021  ° °Stable, pt to continue current medical treatment  - diet ° °

## 2021-10-11 NOTE — Patient Instructions (Addendum)
Please take all new medication as prescribed - the losartan  Please continue all other medications as before, and refills have been done if requested.  Please have the pharmacy call with any other refills you may need.  Please continue your efforts at being more active, low cholesterol diet, and weight control.  You are otherwise up to date with prevention measures today.  Please keep your appointments with your specialists as you may have planned  Please go to the LAB at the blood drawing area for the tests to be done  You will be contacted by phone if any changes need to be made immediately.  Otherwise, you will receive a letter about your results with an explanation, but please check with MyChart first.  Please remember to sign up for MyChart if you have not done so, as this will be important to you in the future with finding out test results, communicating by private email, and scheduling acute appointments online when needed.  Please make an Appointment to return in 6 months, or sooner if needed

## 2021-10-11 NOTE — Assessment & Plan Note (Signed)
BP Readings from Last 3 Encounters:  10/11/21 (!) 150/72  04/05/21 (!) 142/80  03/21/21 (!) 148/80   uncontrolled, pt to start losartan 50 qd

## 2021-10-11 NOTE — Assessment & Plan Note (Signed)
Last vitamin D Lab Results  Component Value Date   VD25OH 22.41 (L) 03/21/2021   Low, to start oral replacement  

## 2021-10-11 NOTE — Assessment & Plan Note (Signed)
Improved then worsened again yesterday, for volt gel prn

## 2021-12-25 ENCOUNTER — Other Ambulatory Visit: Payer: Self-pay | Admitting: Internal Medicine

## 2022-01-19 ENCOUNTER — Telehealth: Payer: Self-pay | Admitting: Internal Medicine

## 2022-01-19 ENCOUNTER — Other Ambulatory Visit: Payer: Self-pay

## 2022-01-19 MED ORDER — SILDENAFIL CITRATE 100 MG PO TABS: 50.0000 mg | ORAL_TABLET | Freq: Every day | ORAL | 11 refills | Status: DC | PRN

## 2022-01-19 MED ORDER — ATORVASTATIN CALCIUM 40 MG PO TABS: 40.0000 mg | ORAL_TABLET | Freq: Every day | ORAL | 3 refills | Status: DC

## 2022-01-19 NOTE — Telephone Encounter (Signed)
Send [facility]agra [facility] - patient can not afford it from [facility]

## 2022-01-19 NOTE — Telephone Encounter (Signed)
Ok done

## 2022-02-21 ENCOUNTER — Telehealth: Payer: Self-pay | Admitting: Internal Medicine

## 2022-02-21 MED ORDER — LOSARTAN POTASSIUM 50 MG PO TABS: 50.0000 mg | ORAL_TABLET | Freq: Every day | ORAL | 3 refills | Status: DC

## 2022-02-21 NOTE — Telephone Encounter (Signed)
Refill sent to pharmacy.   

## 2022-02-21 NOTE — Telephone Encounter (Signed)
Caller & Relationship to patient: Edward Reid  Call back number: [phone]  Date of last office visit: 10/11/21  Date of next office visit: 04/12/22  Medication(s) to be refilled:  losartan (COZAAR) 50 MG tablet      Preferred Pharmacy:   [facility] - [place], [place] - [address] Phone:  [phone]  Fax:  [phone]

## 2022-04-12 ENCOUNTER — Ambulatory Visit: Payer: Managed Care, Other (non HMO) | Admitting: Internal Medicine

## 2022-04-12 ENCOUNTER — Encounter: Payer: Self-pay | Admitting: Internal Medicine

## 2022-04-12 VITALS — BP 132/78 | HR 73 | Temp 97.9°F | Ht 77.5 in | Wt 255.0 lb

## 2022-04-12 DIAGNOSIS — I10 Essential (primary) hypertension: Secondary | ICD-10-CM

## 2022-04-12 DIAGNOSIS — G8929 Other chronic pain: Secondary | ICD-10-CM

## 2022-04-12 DIAGNOSIS — R7989 Other specified abnormal findings of blood chemistry: Secondary | ICD-10-CM

## 2022-04-12 DIAGNOSIS — R739 Hyperglycemia, unspecified: Secondary | ICD-10-CM

## 2022-04-12 DIAGNOSIS — E538 Deficiency of other specified B group vitamins: Secondary | ICD-10-CM | POA: Diagnosis not present

## 2022-04-12 DIAGNOSIS — E7800 Pure hypercholesterolemia, unspecified: Secondary | ICD-10-CM

## 2022-04-12 DIAGNOSIS — M545 Low back pain, unspecified: Secondary | ICD-10-CM

## 2022-04-12 DIAGNOSIS — R9431 Abnormal electrocardiogram [ECG] [EKG]: Secondary | ICD-10-CM

## 2022-04-12 LAB — BASIC METABOLIC PANEL
BUN: 14 mg/dL (ref 6–23)
CO2: 26 mEq/L (ref 19–32)
Calcium: 8.9 mg/dL (ref 8.4–10.5)
Chloride: 102 mEq/L (ref 96–112)
Creatinine, Ser: 1 mg/dL (ref 0.40–1.50)
GFR: 83.27 mL/min (ref >60.00)
Glucose, Bld: 96 mg/dL (ref 70–99)
Potassium: 3.8 mEq/L (ref 3.5–5.1)
Sodium: 138 mEq/L (ref 135–145)

## 2022-04-12 LAB — HEPATIC FUNCTION PANEL
ALT: 16 U/L (ref 0–53)
AST: 15 U/L (ref 0–37)
Albumin: 4.1 g/dL (ref 3.5–5.2)
Alkaline Phosphatase: 63 U/L (ref 39–117)
Bilirubin, Direct: 0.1 mg/dL (ref 0.0–0.3)
Total Bilirubin: 0.6 mg/dL (ref 0.2–1.2)
Total Protein: 6.9 g/dL (ref 6.0–8.3)

## 2022-04-12 LAB — LIPID PANEL
Cholesterol: 148 mg/dL (ref 0–200)
HDL: 32.8 mg/dL — ABNORMAL LOW (ref >39.00)
LDL Cholesterol: 98 mg/dL (ref 0–99)
NonHDL: 114.93
Total CHOL/HDL Ratio: 5
Triglycerides: 86 mg/dL (ref 0.0–149.0)
VLDL: 17.2 mg/dL (ref 0.0–40.0)

## 2022-04-12 LAB — HEMOGLOBIN A1C: Hgb A1c MFr Bld: 6 % (ref 4.6–6.5)

## 2022-04-12 LAB — VITAMIN B12: Vitamin B-12: 323 pg/mL (ref 211–911)

## 2022-04-12 LAB — VITAMIN D 25 HYDROXY (VIT D DEFICIENCY, FRACTURES): VITD: 19.98 ng/mL — ABNORMAL LOW (ref 30.00–100.00)

## 2022-04-12 MED ORDER — CYCLOBENZAPRINE HCL 5 MG PO TABS: 5.0000 mg | ORAL_TABLET | Freq: Three times a day (TID) | ORAL | 1 refills | Status: DC | PRN

## 2022-04-12 NOTE — Patient Instructions (Signed)
.  Please take all new medication as prescribed - the muscle relaxer as needed for back pain  Please continue all other medications as before, and refills have been done if requested.  Please have the pharmacy call with any other refills you may need.  Please continue your efforts at being more active, low cholesterol diet, and weight control.  Please keep your appointments with your specialists as you may have planned  You will be contacted regarding the referral for: Cardiac CT score  Please go to the LAB at the blood drawing area for the tests to be done  You will be contacted by phone if any changes need to be made immediately.  Otherwise, you will receive a letter about your results with an explanation, but please check with MyChart first.  Please remember to sign up for MyChart if you have not done so, as this will be important to you in the future with finding out test results, communicating by private email, and scheduling acute appointments online when needed.  Please make an Appointment to return in 6 months, or sooner if needed

## 2022-04-12 NOTE — Assessment & Plan Note (Signed)
Last vitamin D Lab Results  Component Value Date   VD25OH 21.73 (L) 10/11/2021   Low, to start oral replacement

## 2022-04-12 NOTE — Progress Notes (Signed)
Patient ID: Edward Reid, male   DOB: November 25, 1965, 56 y.o.   MRN: [id number]        Chief Complaint: follow up HTN, HLD and hyperglycemia , left low back pain, low Vit D and B12       HPI:  Edward Reid is a 56 y.o. male here to f/u, Pt denies chest pain, increased sob or doe, wheezing, orthopnea, PND, increased LE swelling, palpitations, dizziness or syncope.   Pt denies polydipsia, polyuria, or new focal neuro s/s.    Pt denies fever, wt loss, night sweats, loss of appetite, or other constitutional symptoms  Pt also has 1 mo recurring left LBP worsening, but no bowel or bladder change, fever, wt loss,  worsening LE pain/numbness/weakness, gait change or falls.  Taking B12, not taking Vti D.  Pt is ok for Card Ct Score for risk stratification.  BP has been < 140/90        Wt Readings from Last 3 Encounters:  04/12/22 255 lb (115.7 kg)  10/11/21 266 lb 12.8 oz (121 kg)  04/05/21 266 lb (120.7 kg)   BP Readings from Last 3 Encounters:  04/12/22 132/78  10/11/21 (!) 150/72  04/05/21 (!) 142/80         Past Medical History:  Diagnosis Date   ALLERGIC RHINITIS    Erectile dysfunction    FASCIITIS, PLANTAR    HYPERCHOLESTEROLEMIA    HYPERLIPIDEMIA    HYPERTENSION    INTERNAL HEMORRHOIDS    Past Surgical History:  Procedure Laterality Date   FACIAL FRACTURE SURGERY     Mass excision from Left forearm     lipoma    reports that he has never smoked. He has never used smokeless tobacco. He reports current alcohol use of about 2.0 standard drinks of alcohol per week. He reports that he does not use drugs. family history includes Coronary artery disease in his father and mother; Diabetes in his mother; Lung cancer in his father; Prostate cancer in his father. Allergies  Allergen Reactions   Codeine Other (See Comments)    headache   Current Outpatient Medications on File Prior to Visit  Medication Sig Dispense Refill   aspirin EC 81 MG tablet Take 1 tablet (81 mg total) by mouth daily.  90 tablet 11   atorvastatin (LIPITOR) 40 MG tablet Take 1 tablet (40 mg total) by mouth daily. 90 tablet 3   budesonide-formoterol (SYMBICORT) 160-4.5 MCG/ACT inhaler TAKE 2 PUFFS BY MOUTH TWICE A DAY 30.6 Inhaler 3   Cholecalciferol 50 MCG (2000 UT) TABS 2 tab by mouth once daily 60 tablet 99   losartan (COZAAR) 50 MG tablet Take 1 tablet (50 mg total) by mouth daily. 90 tablet 3   meclizine (ANTIVERT) 12.5 MG tablet TAKE 1 TABLET (12.5 MG TOTAL) BY MOUTH 3 (THREE) TIMES DAILY AS NEEDED FOR DIZZINESS. 40 tablet 3   NON FORMULARY Mucous relief pe     sildenafil (VIAGRA) 100 MG tablet Take 0.5-1 tablets (50-100 mg total) by mouth daily as needed for erectile dysfunction. 5 tablet 11   No current facility-administered medications on file prior to visit.        ROS:  All others reviewed and negative.  Objective        PE:  BP 132/78 (BP Location: Right Arm, Patient Position: Sitting, Cuff Size: Large)   Pulse 73   Temp 97.9 F (36.6 C) (Oral)   Ht 6' 5.5" (1.969 m)   Wt 255 lb (115.7 kg)  SpO2 98%   BMI 29.85 kg/m                 Constitutional: Pt appears in NAD               HENT: Head: NCAT.                Right Ear: External ear normal.                 Left Ear: External ear normal.                Eyes: . Pupils are equal, round, and reactive to light. Conjunctivae and EOM are normal               Nose: without d/c or deformity               Neck: Neck supple. Gross normal ROM               Cardiovascular: Normal rate and regular rhythm.                 Pulmonary/Chest: Effort normal and breath sounds without rales or wheezing.                Abd:  Soft, NT, ND, + BS, no organomegaly               Neurological: Pt is alert. At baseline orientation, motor grossly intact               Skin: Skin is warm. No rashes, no other new lesions, LE edema - none               Psychiatric: Pt behavior is normal without agitation   Micro: none  Cardiac tracings I have personally  interpreted today:  none  Pertinent Radiological findings (summarize): none   Lab Results  Component Value Date   WBC 6.6 10/11/2021   HGB 14.6 10/11/2021   HCT 44.1 10/11/2021   PLT 213.0 10/11/2021   GLUCOSE 96 04/12/2022   CHOL 148 04/12/2022   TRIG 86.0 04/12/2022   HDL 32.80 (L) 04/12/2022   LDLDIRECT 135.0 12/12/2016   LDLCALC 98 04/12/2022   ALT 16 04/12/2022   AST 15 04/12/2022   NA 138 04/12/2022   K 3.8 04/12/2022   CL 102 04/12/2022   CREATININE 1.00 04/12/2022   BUN 14 04/12/2022   CO2 26 04/12/2022   TSH 1.01 10/11/2021   PSA 1.34 10/11/2021   HGBA1C 6.0 04/12/2022   Assessment/Plan:  Edward Reid is a 55 y.o. Black or African American [2] male with  has a past medical history of ALLERGIC RHINITIS, Erectile dysfunction, FASCIITIS, PLANTAR, HYPERCHOLESTEROLEMIA, HYPERLIPIDEMIA, HYPERTENSION, and INTERNAL HEMORRHOIDS.  Low vitamin D level Last vitamin D Lab Results  Component Value Date   VD25OH 21.73 (L) 10/11/2021   Low, to start oral replacement   B12 deficiency Lab Results  Component Value Date   VITAMINB12 323 04/12/2022   Stable, cont oral replacement - b12 1000 mcg qd   HYPERCHOLESTEROLEMIA Lab Results  Component Value Date   LDLCALC 98 04/12/2022   Stable, pt to continue current statin liiptor 40 mg, and also check Cardiac CT Score   Essential hypertension BP Readings from Last 3 Encounters:  04/12/22 132/78  10/11/21 (!) 150/72  04/05/21 (!) 142/80   Stable, pt to continue medical treatment losartan 50 mg qd   Hyperglycemia Lab Results  Component Value Date   HGBA1C 6.0  04/12/2022   Stable, pt to continue current medical treatment  - diet, wt control, excercise   Low back pain Exam c/w msk strain - for flexeril prn,  to f/u any worsening symptoms or concerns  Followup: Return in about 6 months (around 10/13/2022).  Oliver Barre, MD 04/15/2022 2:57 PM [facility] [place] [facility] - [facility] Internal  Medicine

## 2022-04-15 ENCOUNTER — Encounter: Payer: Self-pay | Admitting: Internal Medicine

## 2022-04-15 DIAGNOSIS — E538 Deficiency of other specified B group vitamins: Secondary | ICD-10-CM | POA: Insufficient documentation

## 2022-04-15 DIAGNOSIS — M545 Low back pain, unspecified: Secondary | ICD-10-CM | POA: Insufficient documentation

## 2022-04-15 NOTE — Assessment & Plan Note (Signed)
Exam c/w msk strain - for flexeril prn,  to f/u any worsening symptoms or concerns

## 2022-04-15 NOTE — Assessment & Plan Note (Signed)
BP Readings from Last 3 Encounters:  04/12/22 132/78  10/11/21 (!) 150/72  04/05/21 (!) 142/80   Stable, pt to continue medical treatment losartan 50 mg qd

## 2022-04-15 NOTE — Assessment & Plan Note (Signed)
Lab Results  Component Value Date   LDLCALC 98 04/12/2022   Stable, pt to continue current statin liiptor 40 mg, and also check Cardiac CT Score

## 2022-04-15 NOTE — Assessment & Plan Note (Signed)
Lab Results  Component Value Date   HGBA1C 6.0 04/12/2022   Stable, pt to continue current medical treatment  - diet, wt control, excercise  

## 2022-04-15 NOTE — Assessment & Plan Note (Signed)
Lab Results  Component Value Date   VITAMINB12 323 04/12/2022   Stable, cont oral replacement - b12 1000 mcg qd

## 2022-04-16 ENCOUNTER — Telehealth: Payer: Self-pay | Admitting: Internal Medicine

## 2022-04-16 NOTE — Telephone Encounter (Signed)
Called pt gave him MD response on labs from 04/12/22.Edward KitchenRaechel Chute

## 2022-04-16 NOTE — Telephone Encounter (Signed)
Pt is requesting a call to go over his lab results from 04/12/22. Pt said he is unable to get into my chart to read the results.    Please advise

## 2022-04-17 ENCOUNTER — Ambulatory Visit: Payer: BC Managed Care – PPO | Admitting: Internal Medicine

## 2022-05-17 ENCOUNTER — Ambulatory Visit (HOSPITAL_BASED_OUTPATIENT_CLINIC_OR_DEPARTMENT_OTHER)
Admission: RE | Admit: 2022-05-17 | Discharge: 2022-05-17 | Disposition: A | Discharge status: Home / Self Care | Payer: BC Managed Care – PPO | Source: Ambulatory Visit | Attending: Internal Medicine | Admitting: Internal Medicine

## 2022-05-17 DIAGNOSIS — E7800 Pure hypercholesterolemia, unspecified: Secondary | ICD-10-CM | POA: Insufficient documentation

## 2022-05-17 DIAGNOSIS — R739 Hyperglycemia, unspecified: Secondary | ICD-10-CM | POA: Insufficient documentation

## 2022-05-17 DIAGNOSIS — R9431 Abnormal electrocardiogram [ECG] [EKG]: Secondary | ICD-10-CM | POA: Insufficient documentation

## 2022-05-17 DIAGNOSIS — I10 Essential (primary) hypertension: Secondary | ICD-10-CM | POA: Insufficient documentation

## 2022-08-09 ENCOUNTER — Other Ambulatory Visit: Payer: Self-pay

## 2022-08-09 ENCOUNTER — Telehealth: Payer: Self-pay | Admitting: Internal Medicine

## 2022-08-09 MED ORDER — LOSARTAN POTASSIUM 50 MG PO TABS: 50.0000 mg | ORAL_TABLET | Freq: Every day | ORAL | 3 refills | Status: DC

## 2022-08-09 NOTE — Telephone Encounter (Signed)
Refill request sent to pharmacy, patient informed.

## 2022-08-09 NOTE — Telephone Encounter (Signed)
Patient callled and states that his rx's need to be called into [facility] on [address] [facility] Patient needs his losartain called in   Patient's   [phone]

## 2022-09-20 ENCOUNTER — Other Ambulatory Visit: Payer: Self-pay | Admitting: Internal Medicine

## 2022-09-20 NOTE — Telephone Encounter (Signed)
Please refill as per office routine med refill policy (all routine meds to be refilled for 3 mo or monthly (per pt preference) up to one year from last visit, then month to month grace period for 3 mo, then further med refills will have to be denied)

## 2022-10-18 ENCOUNTER — Other Ambulatory Visit: Payer: Self-pay | Admitting: Internal Medicine

## 2022-10-18 NOTE — Telephone Encounter (Signed)
Please refill as per office routine med refill policy (all routine meds to be refilled for 3 mo or monthly (per pt preference) up to one year from last visit, then month to month grace period for 3 mo, then further med refills will have to be denied) ? ?

## 2022-11-01 ENCOUNTER — Other Ambulatory Visit: Payer: Self-pay | Admitting: Internal Medicine

## 2022-11-02 NOTE — Telephone Encounter (Signed)
Please refill as per office routine med refill policy (all routine meds to be refilled for 3 mo or monthly (per pt preference) up to one year from last visit, then month to month grace period for 3 mo, then further med refills will have to be denied) ? ?

## 2022-12-27 ENCOUNTER — Other Ambulatory Visit: Payer: Self-pay

## 2022-12-27 MED ORDER — LOSARTAN POTASSIUM 50 MG PO TABS: 50.0000 mg | ORAL_TABLET | Freq: Every day | ORAL | 3 refills | Status: DC

## 2022-12-27 MED ORDER — SILDENAFIL CITRATE 100 MG PO TABS: 50.0000 mg | ORAL_TABLET | Freq: Every day | ORAL | 11 refills | Status: DC | PRN

## 2022-12-27 MED ORDER — ATORVASTATIN CALCIUM 40 MG PO TABS: ORAL_TABLET | ORAL | 1 refills | Status: DC

## 2023-04-03 ENCOUNTER — Encounter (INDEPENDENT_AMBULATORY_CARE_PROVIDER_SITE_OTHER): Payer: Self-pay

## 2023-04-16 ENCOUNTER — Ambulatory Visit (INDEPENDENT_AMBULATORY_CARE_PROVIDER_SITE_OTHER): Payer: BC Managed Care – PPO | Admitting: Internal Medicine

## 2023-04-16 ENCOUNTER — Encounter: Payer: Self-pay | Admitting: Internal Medicine

## 2023-04-16 VITALS — BP 130/82 | HR 80 | Temp 98.0°F | Ht 77.5 in | Wt 263.0 lb

## 2023-04-16 DIAGNOSIS — Z125 Encounter for screening for malignant neoplasm of prostate: Secondary | ICD-10-CM | POA: Diagnosis not present

## 2023-04-16 DIAGNOSIS — Z0001 Encounter for general adult medical examination with abnormal findings: Secondary | ICD-10-CM

## 2023-04-16 DIAGNOSIS — E538 Deficiency of other specified B group vitamins: Secondary | ICD-10-CM | POA: Diagnosis not present

## 2023-04-16 DIAGNOSIS — Z0000 Encounter for general adult medical examination without abnormal findings: Secondary | ICD-10-CM

## 2023-04-16 DIAGNOSIS — R221 Localized swelling, mass and lump, neck: Secondary | ICD-10-CM | POA: Diagnosis not present

## 2023-04-16 DIAGNOSIS — I10 Essential (primary) hypertension: Secondary | ICD-10-CM | POA: Diagnosis not present

## 2023-04-16 DIAGNOSIS — E7800 Pure hypercholesterolemia, unspecified: Secondary | ICD-10-CM

## 2023-04-16 DIAGNOSIS — R739 Hyperglycemia, unspecified: Secondary | ICD-10-CM

## 2023-04-16 DIAGNOSIS — R7989 Other specified abnormal findings of blood chemistry: Secondary | ICD-10-CM

## 2023-04-16 LAB — BASIC METABOLIC PANEL
BUN: 12 mg/dL (ref 6–23)
CO2: 27 mEq/L (ref 19–32)
Calcium: 8.7 mg/dL (ref 8.4–10.5)
Chloride: 102 mEq/L (ref 96–112)
Creatinine, Ser: 1 mg/dL (ref 0.40–1.50)
GFR: 82.69 mL/min (ref >60.00)
Glucose, Bld: 90 mg/dL (ref 70–99)
Potassium: 4 mEq/L (ref 3.5–5.1)
Sodium: 136 mEq/L (ref 135–145)

## 2023-04-16 LAB — CBC WITH DIFFERENTIAL/PLATELET
Basophils Absolute: 0 10*3/uL (ref 0.0–0.1)
Basophils Relative: 0.4 % (ref 0.0–3.0)
Eosinophils Absolute: 0.3 10*3/uL (ref 0.0–0.7)
Eosinophils Relative: 4.1 % (ref 0.0–5.0)
HCT: 45.5 % (ref 39.0–52.0)
Hemoglobin: 14.8 g/dL (ref 13.0–17.0)
Lymphocytes Relative: 31.3 % (ref 12.0–46.0)
Lymphs Abs: 2 10*3/uL (ref 0.7–4.0)
MCHC: 32.6 g/dL (ref 30.0–36.0)
MCV: 85 fl (ref 78.0–100.0)
Monocytes Absolute: 0.7 10*3/uL (ref 0.1–1.0)
Monocytes Relative: 10.4 % (ref 3.0–12.0)
Neutro Abs: 3.4 10*3/uL (ref 1.4–7.7)
Neutrophils Relative %: 53.8 % (ref 43.0–77.0)
Platelets: 211 10*3/uL (ref 150.0–400.0)
RBC: 5.35 Mil/uL (ref 4.22–5.81)
RDW: 13.9 % (ref 11.5–15.5)
WBC: 6.3 10*3/uL (ref 4.0–10.5)

## 2023-04-16 LAB — URINALYSIS, ROUTINE W REFLEX MICROSCOPIC
Bilirubin Urine: NEGATIVE
Hgb urine dipstick: NEGATIVE
Ketones, ur: NEGATIVE
Leukocytes,Ua: NEGATIVE
Nitrite: NEGATIVE
RBC / HPF: NONE SEEN (ref 0–?)
Specific Gravity, Urine: >=1.03 — AB (ref 1.000–1.030)
Total Protein, Urine: NEGATIVE
Urine Glucose: NEGATIVE
Urobilinogen, UA: 1 (ref 0.0–1.0)
pH: 6 (ref 5.0–8.0)

## 2023-04-16 LAB — VITAMIN B12: Vitamin B-12: 166 pg/mL — ABNORMAL LOW (ref 211–911)

## 2023-04-16 LAB — LIPID PANEL
Cholesterol: 205 mg/dL — ABNORMAL HIGH (ref 0–200)
HDL: 33.8 mg/dL — ABNORMAL LOW (ref >39.00)
LDL Cholesterol: 137 mg/dL — ABNORMAL HIGH (ref 0–99)
NonHDL: 171.4
Total CHOL/HDL Ratio: 6
Triglycerides: 173 mg/dL — ABNORMAL HIGH (ref 0.0–149.0)
VLDL: 34.6 mg/dL (ref 0.0–40.0)

## 2023-04-16 LAB — HEPATIC FUNCTION PANEL
ALT: 21 U/L (ref 0–53)
AST: 19 U/L (ref 0–37)
Albumin: 4.1 g/dL (ref 3.5–5.2)
Alkaline Phosphatase: 57 U/L (ref 39–117)
Bilirubin, Direct: 0.1 mg/dL (ref 0.0–0.3)
Total Bilirubin: 0.5 mg/dL (ref 0.2–1.2)
Total Protein: 6.9 g/dL (ref 6.0–8.3)

## 2023-04-16 LAB — MICROALBUMIN / CREATININE URINE RATIO
Creatinine,U: 221.9 mg/dL
Microalb Creat Ratio: 0.3 mg/g (ref 0.0–30.0)
Microalb, Ur: <0.7 mg/dL (ref 0.0–1.9)

## 2023-04-16 LAB — HEMOGLOBIN A1C: Hgb A1c MFr Bld: 5.9 % (ref 4.6–6.5)

## 2023-04-16 LAB — VITAMIN D 25 HYDROXY (VIT D DEFICIENCY, FRACTURES): VITD: 28.88 ng/mL — ABNORMAL LOW (ref 30.00–100.00)

## 2023-04-16 LAB — TSH: TSH: 0.94 u[IU]/mL (ref 0.35–5.50)

## 2023-04-16 LAB — PSA: PSA: 1.81 ng/mL (ref 0.10–4.00)

## 2023-04-16 MED ORDER — ATORVASTATIN CALCIUM 40 MG PO TABS: ORAL_TABLET | ORAL | 3 refills | Status: DC

## 2023-04-16 MED ORDER — LOSARTAN POTASSIUM 50 MG PO TABS: 50.0000 mg | ORAL_TABLET | Freq: Every day | ORAL | 3 refills | Status: DC

## 2023-04-16 MED ORDER — BUDESONIDE-FORMOTEROL FUMARATE 160-4.5 MCG/ACT IN AERO: INHALATION_SPRAY | RESPIRATORY_TRACT | 3 refills | Status: DC

## 2023-04-16 MED ORDER — SILDENAFIL CITRATE 100 MG PO TABS: 50.0000 mg | ORAL_TABLET | Freq: Every day | ORAL | 11 refills | Status: DC | PRN

## 2023-04-16 NOTE — Patient Instructions (Addendum)
Please continue all other medications as before, and refills have been done if requested.  Please have the pharmacy call with any other refills you may need.  Please continue your efforts at being more active, low cholesterol diet, and weight control.  You are otherwise up to date with prevention measures today.  Please keep your appointments with your specialists as you may have planned  You will be contacted regarding the referral for: ENT  Please go to the LAB at the blood drawing area for the tests to be done  You will be contacted by phone if any changes need to be made immediately.  Otherwise, you will receive a letter about your results with an explanation, but please check with MyChart first.  Please make an Appointment to return for your 1 year visit, or sooner if needed

## 2023-04-16 NOTE — Progress Notes (Unsigned)
Patient ID: Edward Reid, male   DOB: [birth year], 57 y.o.   MRN: [id number]         Chief Complaint:: wellness exam and low vit d, htn, right neck lump, hld, hyperglycemia, low b12        HPI:  Edward Reid is a 57 y.o. male here for wellness exam; for flu shot at pharmayc, o/w up to date                        Also Pt denies chest pain, increased sob or doe, wheezing, orthopnea, PND, increased LE swelling, palpitations, dizziness or syncope.   Pt denies polydipsia, polyuria, or new focal neuro s/s.    Pt denies fever, wt loss, night sweats, loss of appetite, or other constitutional symptoms  Has new lump to right neck below angle jaw firm for several months.     Wt Readings from Last 3 Encounters:  04/16/23 263 lb (119.3 kg)  04/12/22 255 lb (115.7 kg)  10/11/21 266 lb 12.8 oz (121 kg)   BP Readings from Last 3 Encounters:  04/16/23 130/82  04/12/22 132/78  10/11/21 (!) 150/72   Immunization History  Administered Date(s) Administered   Influenza,inj,Quad PF,6+ Mos 08/10/2014, 05/07/2017, 06/15/2020, 07/03/2021   PFIZER Comirnaty(Gray Top)Covid-19 Tri-Sucrose Vaccine 04/17/2021   PFIZER(Purple Top)SARS-COV-2 Vaccination 11/26/2019, 12/21/2019, 08/25/2020   Td 09/04/1996, 09/29/2010   Tdap 04/05/2021   Zoster Recombinant(Shingrix) 04/17/2021, 05/04/2021, 07/03/2021, 08/03/2021   Health Maintenance Due  Topic Date Due   INFLUENZA VACCINE  04/04/2023      Past Medical History:  Diagnosis Date   ALLERGIC RHINITIS    Erectile dysfunction    FASCIITIS, PLANTAR    HYPERCHOLESTEROLEMIA    HYPERLIPIDEMIA    HYPERTENSION    INTERNAL HEMORRHOIDS    Past Surgical History:  Procedure Laterality Date   FACIAL FRACTURE SURGERY     Mass excision from Left forearm     lipoma    reports that he has never smoked. He has never used smokeless tobacco. He reports current alcohol use of about 2.0 standard drinks of alcohol per week. He reports that he does not use drugs. family history  includes Coronary artery disease in his father and mother; Diabetes in his mother; Lung cancer in his father; Prostate cancer in his father. Allergies  Allergen Reactions   Codeine Other (See Comments)    headache   Current Outpatient Medications on File Prior to Visit  Medication Sig Dispense Refill   aspirin EC 81 MG tablet Take 1 tablet (81 mg total) by mouth daily. 90 tablet 11   Cholecalciferol 50 MCG (2000 UT) TABS 2 tab by mouth once daily 60 tablet 99   cyclobenzaprine (FLEXERIL) 5 MG tablet Take 1 tablet (5 mg total) by mouth 3 (three) times daily as needed. 60 tablet 1   NON FORMULARY Mucous relief pe     No current facility-administered medications on file prior to visit.        ROS:  All others reviewed and negative.  Objective        PE:  BP 130/82 (BP Location: Right Arm, Patient Position: Sitting, Cuff Size: Normal)   Pulse 80   Temp 98 F (36.7 C) (Oral)   Ht 6' 5.5" (1.969 m)   Wt 263 lb (119.3 kg)   SpO2 98%   BMI 30.79 kg/m                 Constitutional: Pt  appears in NAD               HENT: Head: NCAT.                Right Ear: External ear normal.                 Left Ear: External ear normal.                Eyes: . Pupils are equal, round, and reactive to light. Conjunctivae and EOM are normal               Nose: without d/c or deformity               Neck: Neck supple. Gross normal ROM, has approx 1 cm firm lump below the angle right jaw, no other LA or tender               Cardiovascular: Normal rate and regular rhythm.                 Pulmonary/Chest: Effort normal and breath sounds without rales or wheezing.                Abd:  Soft, NT, ND, + BS, no organomegaly               Neurological: Pt is alert. At baseline orientation, motor grossly intact               Skin: Skin is warm. No rashes, no other new lesions, LE edema - none               Psychiatric: Pt behavior is normal without agitation   Micro: none  Cardiac tracings I have personally  interpreted today:  none  Pertinent Radiological findings (summarize): none   Lab Results  Component Value Date   WBC 6.3 04/16/2023   HGB 14.8 04/16/2023   HCT 45.5 04/16/2023   PLT 211.0 04/16/2023   GLUCOSE 90 04/16/2023   CHOL 205 (H) 04/16/2023   TRIG 173.0 (H) 04/16/2023   HDL 33.80 (L) 04/16/2023   LDLDIRECT 135.0 12/12/2016   LDLCALC 137 (H) 04/16/2023   ALT 21 04/16/2023   AST 19 04/16/2023   NA 136 04/16/2023   K 4.0 04/16/2023   CL 102 04/16/2023   CREATININE 1.00 04/16/2023   BUN 12 04/16/2023   CO2 27 04/16/2023   TSH 0.94 04/16/2023   PSA 1.81 04/16/2023   HGBA1C 5.9 04/16/2023   MICROALBUR <0.7 04/16/2023   Assessment/Plan:  Edward Reid is a 57 y.o. Black or African American [2] male with  has a past medical history of ALLERGIC RHINITIS, Erectile dysfunction, FASCIITIS, PLANTAR, HYPERCHOLESTEROLEMIA, HYPERLIPIDEMIA, HYPERTENSION, and INTERNAL HEMORRHOIDS.  Encounter for well adult exam with abnormal findings Age and sex appropriate education and counseling updated with regular exercise and diet Referrals for preventative services - none needed Immunizations addressed - for flu shot at pharmacy Smoking counseling  - none needed Evidence for depression or other mood disorder - none significant Most recent labs reviewed. I have personally reviewed and have noted: 1) the patient's medical and social history 2) The patient's current medications and supplements 3) The patient's height, weight, and BMI have been recorded in the chart   HYPERCHOLESTEROLEMIA Lab Results  Component Value Date   LDLCALC 137 (H) 04/16/2023   Uncontrolled, goal ldl < 100, pt to restart lipito r40 qd   Essential hypertension BP Readings from Last 3 Encounters:  04/16/23  130/82  04/12/22 132/78  10/11/21 (!) 150/72   Stable, pt to continue medical treatment losartan 50 qd   Hyperglycemia Lab Results  Component Value Date   HGBA1C 5.9 04/16/2023   Stable, pt to  continue current medical treatment  - diet, wt control   Low vitamin D level Last vitamin D Lab Results  Component Value Date   VD25OH 28.88 (L) 04/16/2023   Low, to start oral replacement   B12 deficiency Lab Results  Component Value Date   VITAMINB12 166 (L) 04/16/2023   Low, to start oral replacement - b12 1000 mcg qd   Lump on neck Can't r/o malignancy, for ENT referral Followup: Return in about 1 year (around 04/15/2024).  Oliver Barre, MD 04/17/2023 9:38 PM [facility]  Primary Care - [facility] Internal Medicine

## 2023-04-17 ENCOUNTER — Encounter: Payer: Self-pay | Admitting: Internal Medicine

## 2023-04-17 DIAGNOSIS — R221 Localized swelling, mass and lump, neck: Secondary | ICD-10-CM | POA: Insufficient documentation

## 2023-04-17 NOTE — Assessment & Plan Note (Signed)
BP Readings from Last 3 Encounters:  04/16/23 130/82  04/12/22 132/78  10/11/21 (!) 150/72   Stable, pt to continue medical treatment losartan 50 qd

## 2023-04-17 NOTE — Assessment & Plan Note (Signed)
Lab Results  Component Value Date   HGBA1C 5.9 04/16/2023   Stable, pt to continue current medical treatment  - diet, wt control

## 2023-04-17 NOTE — Assessment & Plan Note (Signed)
Last vitamin D Lab Results  Component Value Date   VD25OH 28.88 (L) 04/16/2023   Low, to start oral replacement

## 2023-04-17 NOTE — Assessment & Plan Note (Signed)
Can't r/o malignancy, for ENT referral

## 2023-04-17 NOTE — Assessment & Plan Note (Signed)
Age and sex appropriate education and counseling updated with regular exercise and diet Referrals for preventative services - none needed Immunizations addressed - for flu shot at pharmacy Smoking counseling  - none needed Evidence for depression or other mood disorder - none significant Most recent labs reviewed. I have personally reviewed and have noted: 1) the patient's medical and social history 2) The patient's current medications and supplements 3) The patient's height, weight, and BMI have been recorded in the chart

## 2023-04-17 NOTE — Assessment & Plan Note (Signed)
Lab Results  Component Value Date   LDLCALC 137 (H) 04/16/2023   Uncontrolled, goal ldl < 100, pt to restart lipito r40 qd

## 2023-04-17 NOTE — Assessment & Plan Note (Signed)
Lab Results  Component Value Date   VITAMINB12 166 (L) 04/16/2023   Low, to start oral replacement - b12 1000 mcg qd

## 2023-05-10 ENCOUNTER — Other Ambulatory Visit: Payer: Self-pay | Admitting: Internal Medicine

## 2023-05-10 NOTE — Telephone Encounter (Signed)
Please to ask pt to check with pharmacy regarding which inhaler similar to this is covered by  his insurance     thanks

## 2023-05-13 NOTE — Telephone Encounter (Signed)
Called and let Pt know to contact pharmacy.

## 2023-05-13 NOTE — Telephone Encounter (Signed)
Sorry - please contact pt to ask him to check with pharmacy about which similar inhaler is covered   thanks

## 2023-08-13 DIAGNOSIS — Z6830 Body mass index (BMI) 30.0-30.9, adult: Secondary | ICD-10-CM | POA: Diagnosis not present

## 2023-08-13 DIAGNOSIS — Z833 Family history of diabetes mellitus: Secondary | ICD-10-CM | POA: Diagnosis not present

## 2023-08-13 DIAGNOSIS — Z008 Encounter for other general examination: Secondary | ICD-10-CM | POA: Diagnosis not present

## 2023-08-13 DIAGNOSIS — I10 Essential (primary) hypertension: Secondary | ICD-10-CM | POA: Diagnosis not present

## 2023-08-13 DIAGNOSIS — Z8249 Family history of ischemic heart disease and other diseases of the circulatory system: Secondary | ICD-10-CM | POA: Diagnosis not present

## 2023-08-13 DIAGNOSIS — N529 Male erectile dysfunction, unspecified: Secondary | ICD-10-CM | POA: Diagnosis not present

## 2023-08-13 DIAGNOSIS — Z885 Allergy status to narcotic agent status: Secondary | ICD-10-CM | POA: Diagnosis not present

## 2023-08-13 DIAGNOSIS — Z809 Family history of malignant neoplasm, unspecified: Secondary | ICD-10-CM | POA: Diagnosis not present

## 2023-08-13 DIAGNOSIS — E785 Hyperlipidemia, unspecified: Secondary | ICD-10-CM | POA: Diagnosis not present

## 2023-08-13 DIAGNOSIS — Z823 Family history of stroke: Secondary | ICD-10-CM | POA: Diagnosis not present

## 2023-08-13 DIAGNOSIS — E669 Obesity, unspecified: Secondary | ICD-10-CM | POA: Diagnosis not present

## 2024-04-15 ENCOUNTER — Encounter: Payer: Self-pay | Admitting: Internal Medicine

## 2024-04-15 ENCOUNTER — Ambulatory Visit: Payer: BC Managed Care – PPO | Admitting: Internal Medicine

## 2024-04-15 ENCOUNTER — Ambulatory Visit: Payer: Self-pay | Admitting: Internal Medicine

## 2024-04-15 VITALS — BP 148/68 | HR 74 | Temp 97.9°F | Ht 77.5 in | Wt 261.6 lb

## 2024-04-15 DIAGNOSIS — E7800 Pure hypercholesterolemia, unspecified: Secondary | ICD-10-CM

## 2024-04-15 DIAGNOSIS — N509 Disorder of male genital organs, unspecified: Secondary | ICD-10-CM | POA: Insufficient documentation

## 2024-04-15 DIAGNOSIS — I10 Essential (primary) hypertension: Secondary | ICD-10-CM

## 2024-04-15 DIAGNOSIS — Z0000 Encounter for general adult medical examination without abnormal findings: Secondary | ICD-10-CM

## 2024-04-15 DIAGNOSIS — Z125 Encounter for screening for malignant neoplasm of prostate: Secondary | ICD-10-CM

## 2024-04-15 DIAGNOSIS — E538 Deficiency of other specified B group vitamins: Secondary | ICD-10-CM

## 2024-04-15 DIAGNOSIS — R739 Hyperglycemia, unspecified: Secondary | ICD-10-CM

## 2024-04-15 DIAGNOSIS — R7989 Other specified abnormal findings of blood chemistry: Secondary | ICD-10-CM

## 2024-04-15 DIAGNOSIS — Z0001 Encounter for general adult medical examination with abnormal findings: Secondary | ICD-10-CM

## 2024-04-15 DIAGNOSIS — E559 Vitamin D deficiency, unspecified: Secondary | ICD-10-CM | POA: Diagnosis not present

## 2024-04-15 DIAGNOSIS — Z23 Encounter for immunization: Secondary | ICD-10-CM | POA: Diagnosis not present

## 2024-04-15 LAB — URINALYSIS, ROUTINE W REFLEX MICROSCOPIC
Bilirubin Urine: NEGATIVE
Hgb urine dipstick: NEGATIVE
Ketones, ur: NEGATIVE
Leukocytes,Ua: NEGATIVE
Nitrite: NEGATIVE
RBC / HPF: NONE SEEN (ref 0–?)
Specific Gravity, Urine: 1.025 (ref 1.000–1.030)
Total Protein, Urine: NEGATIVE
Urine Glucose: NEGATIVE
Urobilinogen, UA: 1 (ref 0.0–1.0)
pH: 6 (ref 5.0–8.0)

## 2024-04-15 LAB — BASIC METABOLIC PANEL WITH GFR
BUN: 16 mg/dL (ref 6–23)
CO2: 24 meq/L (ref 19–32)
Calcium: 8.7 mg/dL (ref 8.4–10.5)
Chloride: 105 meq/L (ref 96–112)
Creatinine, Ser: 1.15 mg/dL (ref 0.40–1.50)
GFR: 69.43 mL/min (ref >60.00)
Glucose, Bld: 94 mg/dL (ref 70–99)
Potassium: 4 meq/L (ref 3.5–5.1)
Sodium: 138 meq/L (ref 135–145)

## 2024-04-15 LAB — VITAMIN B12: Vitamin B-12: 904 pg/mL (ref 211–911)

## 2024-04-15 LAB — CBC WITH DIFFERENTIAL/PLATELET
Basophils Absolute: 0 K/uL (ref 0.0–0.1)
Basophils Relative: 0.2 % (ref 0.0–3.0)
Eosinophils Absolute: 0.2 K/uL (ref 0.0–0.7)
Eosinophils Relative: 3.2 % (ref 0.0–5.0)
HCT: 44.9 % (ref 39.0–52.0)
Hemoglobin: 14.7 g/dL (ref 13.0–17.0)
Lymphocytes Relative: 29.9 % (ref 12.0–46.0)
Lymphs Abs: 2 K/uL (ref 0.7–4.0)
MCHC: 32.7 g/dL (ref 30.0–36.0)
MCV: 84.2 fl (ref 78.0–100.0)
Monocytes Absolute: 0.6 K/uL (ref 0.1–1.0)
Monocytes Relative: 9.9 % (ref 3.0–12.0)
Neutro Abs: 3.7 K/uL (ref 1.4–7.7)
Neutrophils Relative %: 56.8 % (ref 43.0–77.0)
Platelets: 205 K/uL (ref 150.0–400.0)
RBC: 5.33 Mil/uL (ref 4.22–5.81)
RDW: 13.6 % (ref 11.5–15.5)
WBC: 6.5 K/uL (ref 4.0–10.5)

## 2024-04-15 LAB — HEPATIC FUNCTION PANEL
ALT: 24 U/L (ref 0–53)
AST: 17 U/L (ref 0–37)
Albumin: 4.1 g/dL (ref 3.5–5.2)
Alkaline Phosphatase: 57 U/L (ref 39–117)
Bilirubin, Direct: 0.1 mg/dL (ref 0.0–0.3)
Total Bilirubin: 0.4 mg/dL (ref 0.2–1.2)
Total Protein: 6.8 g/dL (ref 6.0–8.3)

## 2024-04-15 LAB — LIPID PANEL
Cholesterol: 150 mg/dL (ref 0–200)
HDL: 33.3 mg/dL — ABNORMAL LOW (ref >39.00)
LDL Cholesterol: 96 mg/dL (ref 0–99)
NonHDL: 116.53
Total CHOL/HDL Ratio: 4
Triglycerides: 105 mg/dL (ref 0.0–149.0)
VLDL: 21 mg/dL (ref 0.0–40.0)

## 2024-04-15 LAB — TSH: TSH: 1.08 u[IU]/mL (ref 0.35–5.50)

## 2024-04-15 LAB — HEMOGLOBIN A1C: Hgb A1c MFr Bld: 6.1 % (ref 4.6–6.5)

## 2024-04-15 LAB — PSA: PSA: 1.69 ng/mL (ref 0.10–4.00)

## 2024-04-15 LAB — VITAMIN D 25 HYDROXY (VIT D DEFICIENCY, FRACTURES): VITD: 32.31 ng/mL (ref 30.00–100.00)

## 2024-04-15 MED ORDER — BUDESONIDE-FORMOTEROL FUMARATE 160-4.5 MCG/ACT IN AERO: INHALATION_SPRAY | RESPIRATORY_TRACT | 3 refills | Status: AC

## 2024-04-15 MED ORDER — ATORVASTATIN CALCIUM 40 MG PO TABS: ORAL_TABLET | ORAL | 3 refills | Status: AC

## 2024-04-15 MED ORDER — LOSARTAN POTASSIUM 50 MG PO TABS: 50.0000 mg | ORAL_TABLET | Freq: Every day | ORAL | 3 refills | Status: AC

## 2024-04-15 MED ORDER — SILDENAFIL CITRATE 100 MG PO TABS: 50.0000 mg | ORAL_TABLET | Freq: Every day | ORAL | 11 refills | Status: AC | PRN

## 2024-04-15 NOTE — Addendum Note (Signed)
 Addended byBETHA LUCETTA CLEATRICE LELON on: 04/15/2024 09:42 AM   Modules accepted: Orders

## 2024-04-15 NOTE — Assessment & Plan Note (Signed)
 Lab Results  Component Value Date   LDLCALC 137 (H) 04/16/2023   Uncontrolled now on increased lipitor 40 mg, pt to continue current statin and f/u lab today

## 2024-04-15 NOTE — Progress Notes (Signed)
 The test results show that your current treatment is OK, as the tests are stable.  Please continue the same plan.  There is no other need for change of treatment or further evaluation based on these results, at this time.  thanks

## 2024-04-15 NOTE — Assessment & Plan Note (Signed)
 Lab Results  Component Value Date   VITAMINB12 166 (L) 04/16/2023   Low, to start oral replacement - b12 1000 mcg qd

## 2024-04-15 NOTE — Assessment & Plan Note (Addendum)
 BP Readings from Last 3 Encounters:  04/15/24 (!) 148/68  04/16/23 130/82  04/12/22 132/78   Uncontrolled, for med restart toda,, pt to continue medical treatment losartan  50 mg qd

## 2024-04-15 NOTE — Progress Notes (Signed)
 Patient ID: Edward Reid, male   DOB: December 06, 1965, 58 y.o.   MRN: [id number]         Chief Complaint:: wellness exam and Annual Exam (Annual Exam. FYI of patient request for urology referral)  , low vit d and b12, hyperglycemia, hld, htn       HPI:  Edward Reid is a 58 y.o. male here for wellness exam; due for prevnar 20, o/w up to date                        Also asks to see urology for scrotal lesion new worsening,  Pt denies chest pain, increased sob or doe, wheezing, orthopnea, PND, increased LE swelling, palpitations, dizziness or syncope.   Pt denies polydipsia, polyuria, or new focal neuro s/s.    Pt denies fever, wt loss, night sweats, loss of appetite, or other constitutional symptoms     Wt Readings from Last 3 Encounters:  04/15/24 261 lb 9.6 oz (118.7 kg)  04/16/23 263 lb (119.3 kg)  04/12/22 255 lb (115.7 kg)   BP Readings from Last 3 Encounters:  04/15/24 (!) 148/68  04/16/23 130/82  04/12/22 132/78   Immunization History  Administered Date(s) Administered   Influenza,inj,Quad PF,6+ Mos 08/10/2014, 05/07/2017, 06/15/2020, 07/03/2021   PFIZER Comirnaty(Gray Top)Covid-19 Tri-Sucrose Vaccine 04/17/2021   PFIZER(Purple Top)SARS-COV-2 Vaccination 11/26/2019, 12/21/2019, 08/25/2020   Td 09/04/1996, 09/29/2010   Tdap 04/05/2021   Zoster Recombinant(Shingrix) 04/17/2021, 05/04/2021, 07/03/2021, 08/03/2021   Health Maintenance Due  Topic Date Due   Pneumococcal Vaccine: 50+ Years (1 of 1 - PCV) Never done      Past Medical History:  Diagnosis Date   ALLERGIC RHINITIS    Erectile dysfunction    FASCIITIS, PLANTAR    HYPERCHOLESTEROLEMIA    HYPERLIPIDEMIA    HYPERTENSION    INTERNAL HEMORRHOIDS    Past Surgical History:  Procedure Laterality Date   FACIAL FRACTURE SURGERY     Mass excision from Left forearm     lipoma    reports that he has never smoked. He has never used smokeless tobacco. He reports current alcohol use of about 2.0 standard drinks of alcohol per  week. He reports that he does not use drugs. family history includes Coronary artery disease in his father and mother; Diabetes in his mother; Lung cancer in his father; Prostate cancer in his father. Allergies  Allergen Reactions   Codeine Other (See Comments)    headache   Current Outpatient Medications on File Prior to Visit  Medication Sig Dispense Refill   aspirin  EC 81 MG tablet Take 1 tablet (81 mg total) by mouth daily. 90 tablet 11   Cholecalciferol  50 MCG (2000 UT) TABS 2 tab by mouth once daily 60 tablet 99   NON FORMULARY Mucous relief pe     No current facility-administered medications on file prior to visit.        ROS:  All others reviewed and negative.  Objective        PE:  BP (!) 148/68   Pulse 74   Temp 97.9 F (36.6 C)   Ht 6' 5.5 (1.969 m)   Wt 261 lb 9.6 oz (118.7 kg)   SpO2 97%   BMI 30.62 kg/m                 Constitutional: Pt appears in NAD               HENT: Head: NCAT.  Right Ear: External ear normal.                 Left Ear: External ear normal.                Eyes: . Pupils are equal, round, and reactive to light. Conjunctivae and EOM are normal               Nose: without d/c or deformity               Neck: Neck supple. Gross normal ROM               Cardiovascular: Normal rate and regular rhythm.                 Pulmonary/Chest: Effort normal and breath sounds without rales or wheezing.                Abd:  Soft, NT, ND, + BS, no organomegaly               Neurological: Pt is alert. At baseline orientation, motor grossly intact               Skin: Skin is warm. No rashes, no other new lesions, LE edema - none               Psychiatric: Pt behavior is normal without agitation   Micro: none  Cardiac tracings I have personally interpreted today:  none  Pertinent Radiological findings (summarize): none   Lab Results  Component Value Date   WBC 6.3 04/16/2023   HGB 14.8 04/16/2023   HCT 45.5 04/16/2023   PLT 211.0  04/16/2023   GLUCOSE 90 04/16/2023   CHOL 205 (H) 04/16/2023   TRIG 173.0 (H) 04/16/2023   HDL 33.80 (L) 04/16/2023   LDLDIRECT 135.0 12/12/2016   LDLCALC 137 (H) 04/16/2023   ALT 21 04/16/2023   AST 19 04/16/2023   NA 136 04/16/2023   K 4.0 04/16/2023   CL 102 04/16/2023   CREATININE 1.00 04/16/2023   BUN 12 04/16/2023   CO2 27 04/16/2023   TSH 0.94 04/16/2023   PSA 1.81 04/16/2023   HGBA1C 5.9 04/16/2023   Assessment/Plan:  Edward Reid is a 58 y.o. Black or African American [2] male with  has a past medical history of ALLERGIC RHINITIS, Erectile dysfunction, FASCIITIS, PLANTAR, HYPERCHOLESTEROLEMIA, HYPERLIPIDEMIA, HYPERTENSION, and INTERNAL HEMORRHOIDS.  Encounter for well adult exam with abnormal findings Age and sex appropriate education and counseling updated with regular exercise and diet Referrals for preventative services - none needed Immunizations addressed - for Prevnar 20 today Smoking counseling  - none needed Evidence for depression or other mood disorder - none significant Most recent labs reviewed. I have personally reviewed and have noted: 1) the patient's medical and social history 2) The patient's current medications and supplements 3) The patient's height, weight, and BMI have been recorded in the chart   Low vitamin D  level Last vitamin D  Lab Results  Component Value Date   VD25OH 28.88 (L) 04/16/2023   Low, to start oral replacement   Hyperglycemia Lab Results  Component Value Date   HGBA1C 5.9 04/16/2023   Stable, pt to continue current medical treatment  - diet, wt control   HYPERCHOLESTEROLEMIA Lab Results  Component Value Date   LDLCALC 137 (H) 04/16/2023   Uncontrolled now on increased lipitor 40 mg, pt to continue current statin and f/u lab today    Essential hypertension BP Readings from  Last 3 Encounters:  04/15/24 (!) 148/68  04/16/23 130/82  04/12/22 132/78   Uncontrolled, for med restart toda,, pt to continue  medical treatment losartan  50 mg qd   B12 deficiency Lab Results  Component Value Date   VITAMINB12 166 (L) 04/16/2023   Low, to start oral replacement - b12 1000 mcg qd   Scrotal disorder Also for referral urology for new issue  Followup: Return in about 1 year (around 04/15/2025).  Lynwood Rush, MD 04/15/2024 9:29 AM [facility]

## 2024-04-15 NOTE — Assessment & Plan Note (Signed)
 Lab Results  Component Value Date   HGBA1C 5.9 04/16/2023   Stable, pt to continue current medical treatment  - diet, wt control

## 2024-04-15 NOTE — Assessment & Plan Note (Signed)
 Age and sex appropriate education and counseling updated with regular exercise and diet Referrals for preventative services - none needed Immunizations addressed - for Prevnar 20 today Smoking counseling  - none needed Evidence for depression or other mood disorder - none significant Most recent labs reviewed. I have personally reviewed and have noted: 1) the patient's medical and social history 2) The patient's current medications and supplements 3) The patient's height, weight, and BMI have been recorded in the chart

## 2024-04-15 NOTE — Assessment & Plan Note (Signed)
Last vitamin D Lab Results  Component Value Date   VD25OH 28.88 (L) 04/16/2023   Low, to start oral replacement

## 2024-04-15 NOTE — Patient Instructions (Signed)
 You had the Prevnar 20 pneumonia shot today  Please continue all other medications as before, and refills have been done if requested.  Please have the pharmacy call with any other refills you may need.  Please continue your efforts at being more active, low cholesterol diet, and weight control.  You are otherwise up to date with prevention measures today.  Please keep your appointments with your specialists as you may have planned  You will be contacted regarding the referral for: urology  Please go to the LAB at the blood drawing area for the tests to be done  You will be contacted by phone if any changes need to be made immediately.  Otherwise, you will receive a letter about your results with an explanation, but please check with MyChart first.  Please make an Appointment to return for your 1 year visit, or sooner if needed

## 2024-04-15 NOTE — Assessment & Plan Note (Signed)
 Also for referral urology for new issue

## 2024-08-18 ENCOUNTER — Encounter: Payer: Self-pay | Admitting: Internal Medicine

## 2024-08-18 ENCOUNTER — Ambulatory Visit: Payer: Self-pay

## 2024-08-18 NOTE — Telephone Encounter (Signed)
 Letter has been sent to Patient via Mychart.

## 2024-08-18 NOTE — Telephone Encounter (Signed)
 FYI Only or Action Required?: Action required by provider: clinical question for provider, update on patient condition, and request for documentation or forms.  Patient was last seen in primary care on 04/15/2024 by Norleen Lynwood ORN, MD.  Called Nurse Triage reporting Nasal Congestion.  Symptoms began several days ago.  Interventions attempted: OTC medications: cold & flu and Rest, hydration, or home remedies.  Symptoms are: stable.  Triage Disposition: Home Care  Patient/caregiver understands and will follow disposition?: No, wishes to speak with PCP   Copied from CRM #[id number]. Topic: Clinical - Red Word Triage >> Aug 18, 2024  2:48 PM Berwyn MATSU wrote: Red Word that prompted transfer to Nurse Triage: cold flu like symptoms since Thursday congestion mucus cough headache Reason for Disposition  Cough with cold symptoms (e.g., runny nose, postnasal drip, throat clearing)  Answer Assessment - Initial Assessment Questions Pt called in requesting doctor's note for his work for Friday, 12/12, to Monday,12/15. Pt reports working 3 jobs and after work Thursday, 12/11, began experiencing: body aches, loose stools, chills, H/a and congestion with yellow mucous. 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 Pt states symptoms started to subside Saturday evening so he went to work Sunday, but felt maybe he went back too soon as he had continued congestion and body aches so he took off Monday, 12/15. Pt states today, 12/16, he is able to clear phlegm, yellow/brownish; denies body aches or chills. Pt audibly has nasal drip and congestion.   Pt requesting work note for Friday and Monday; pt works 3 jobs so he is at risk of getting written up. 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 Pt available via phone;  provided information for access to my chart. Please advise.    1. ONSET: When did the cough begin?      Thursday, 12/11  2. SEVERITY: How bad is the cough today?      Today pt feels better; states he took off work Friday d/t increased symptoms. 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   3. SPUTUM: Describe the color of your sputum (e.g., none, dry cough; clear, white, yellow, green)     Yellow/brownish; denies blood or metallic taste in mouth   4. HEMOPTYSIS: Are you coughing up any blood? If Yes, ask: How much? (e.g., flecks, streaks, tablespoons, etc.)     No   5. DIFFICULTY BREATHING: Are you having difficulty breathing? If Yes, ask: How bad is it? (e.g., mild, moderate, severe)      No   6. FEVER: Do you have a fever? If Yes, ask: What is your temperature, how was it measured, and when did it start?     Unknown; pt reports chills last week   10. OTHER SYMPTOMS: Do you have any other symptoms? (e.g., runny nose, wheezing, chest pain)       Runny nose  Protocols used: Cough - Acute Productive-A-AH

## 2024-08-18 NOTE — Telephone Encounter (Signed)
 Ok for work note to patient for those dates - thanks

## 2024-08-19 NOTE — Telephone Encounter (Signed)
 Copied from CRM [id number]. Topic: General - Other >> Aug 19, 2024  2:29 PM [name] wrote: Reason for CRM: Patient is calling to see if he is able to pick up 3 copies of the doctor's note Megan sent him through MyChart yesterday.

## 2024-09-01 ENCOUNTER — Telehealth: Payer: Self-pay

## 2024-09-01 NOTE — Telephone Encounter (Signed)
 Copied from CRM [id number]. Topic: General - Other >> Aug 19, 2024  2:29 PM [name] wrote: Reason for CRM: Patient is calling to see if he is able to pick up 3 copies of the doctor's note Megan sent him through MyChart yesterday. >> Sep 01, 2024  1:08 PM [name] wrote: Patient is calling in again. He is needing 3 physical copies of his doctors note for his 3 jobs. He said that he can't get it off of mychart. Today is his last day to get it. He was already issued a warning. Please call patient. He can come by in the morning.  >> Aug 21, 2024  5:54 PM [name] wrote: Patient is calling for an update on wok excuse. He is needing the physical copies on Monday no later than Tuesday so he can give to his job. Please call patient when it is ready for pickup.

## 2024-09-01 NOTE — Telephone Encounter (Signed)
 Called and left voicemail letting Pt know his letters are ready for pickup.

## 2025-04-22 ENCOUNTER — Encounter: Admitting: Internal Medicine
